# Patient Record
Sex: Male | Born: 1960
Health system: Southern US, Community
[De-identification: ages and names within clinical notes are randomized; demographics above are authoritative.]

## PROBLEM LIST (undated history)

## (undated) DIAGNOSIS — T8859XA Other complications of anesthesia, initial encounter: Secondary | ICD-10-CM

## (undated) DIAGNOSIS — I480 Paroxysmal atrial fibrillation: Secondary | ICD-10-CM

## (undated) DIAGNOSIS — E785 Hyperlipidemia, unspecified: Secondary | ICD-10-CM

## (undated) DIAGNOSIS — K219 Gastro-esophageal reflux disease without esophagitis: Secondary | ICD-10-CM

## (undated) DIAGNOSIS — M1990 Unspecified osteoarthritis, unspecified site: Secondary | ICD-10-CM

## (undated) DIAGNOSIS — R9431 Abnormal electrocardiogram [ECG] [EKG]: Secondary | ICD-10-CM

## (undated) DIAGNOSIS — T4145XA Adverse effect of unspecified anesthetic, initial encounter: Secondary | ICD-10-CM

## (undated) DIAGNOSIS — R000 Tachycardia, unspecified: Secondary | ICD-10-CM

## (undated) HISTORY — DX: Hyperlipidemia, unspecified: E78.5

## (undated) HISTORY — PX: COLONOSCOPY: SHX174

## (undated) HISTORY — PX: VASECTOMY: SHX75

## (undated) HISTORY — DX: Abnormal electrocardiogram (ECG) (EKG): R94.31

## (undated) HISTORY — PX: WISDOM TOOTH EXTRACTION: SHX21

---

## 2000-07-24 ENCOUNTER — Encounter: Admission: RE | Admit: 2000-07-24 | Discharge: 2000-07-24 | Payer: Self-pay | Admitting: Family Medicine

## 2006-07-20 ENCOUNTER — Encounter: Admission: RE | Admit: 2006-07-20 | Discharge: 2006-07-20 | Payer: Self-pay | Admitting: Orthopedic Surgery

## 2012-02-03 DIAGNOSIS — R9431 Abnormal electrocardiogram [ECG] [EKG]: Secondary | ICD-10-CM

## 2012-02-03 HISTORY — DX: Abnormal electrocardiogram (ECG) (EKG): R94.31

## 2013-01-26 ENCOUNTER — Encounter: Payer: Self-pay | Admitting: Cardiovascular Disease

## 2013-01-27 ENCOUNTER — Encounter: Payer: Self-pay | Admitting: Cardiovascular Disease

## 2013-01-27 ENCOUNTER — Other Ambulatory Visit (HOSPITAL_COMMUNITY): Payer: Self-pay | Admitting: Cardiovascular Disease

## 2013-01-27 DIAGNOSIS — R9431 Abnormal electrocardiogram [ECG] [EKG]: Secondary | ICD-10-CM

## 2013-01-27 DIAGNOSIS — R079 Chest pain, unspecified: Secondary | ICD-10-CM

## 2013-02-01 ENCOUNTER — Other Ambulatory Visit (HOSPITAL_COMMUNITY): Payer: Self-pay | Admitting: Cardiovascular Disease

## 2013-02-01 DIAGNOSIS — R079 Chest pain, unspecified: Secondary | ICD-10-CM

## 2013-02-02 ENCOUNTER — Ambulatory Visit (HOSPITAL_COMMUNITY)
Admission: RE | Admit: 2013-02-02 | Discharge: 2013-02-02 | Disposition: A | Discharge status: Home / Self Care | Payer: BC Managed Care – PPO | Source: Ambulatory Visit | Attending: Cardiology | Admitting: Cardiology

## 2013-02-02 DIAGNOSIS — R079 Chest pain, unspecified: Secondary | ICD-10-CM | POA: Insufficient documentation

## 2013-02-02 MED ORDER — TECHNETIUM TC 99M SESTAMIBI GENERIC - CARDIOLITE
10.9000 | Freq: Once | INTRAVENOUS | Status: AC | PRN
  Administered 2013-02-02: 10.9 via INTRAVENOUS

## 2013-02-02 MED ORDER — REGADENOSON 0.4 MG/5ML IV SOLN
0.4000 mg | Freq: Once | INTRAVENOUS | Status: AC
  Administered 2013-02-02: 0.4 mg via INTRAVENOUS

## 2013-02-02 MED ORDER — AMINOPHYLLINE 25 MG/ML IV SOLN
75.0000 mg | Freq: Once | INTRAVENOUS | Status: AC
  Administered 2013-02-02: 75 mg via INTRAVENOUS

## 2013-02-02 MED ORDER — TECHNETIUM TC 99M SESTAMIBI GENERIC - CARDIOLITE
31.1000 | Freq: Once | INTRAVENOUS | Status: AC | PRN
  Administered 2013-02-02: 31.1 via INTRAVENOUS

## 2013-02-02 NOTE — Procedures (Addendum)
[facility] [address] [place] [address] [place] [address] [phone]  Cardiology Nuclear Med Study  Mike Mccann is a 52 y.o. male     MRN : [id number]     DOB: 1961/03/21  Procedure Date: 02/02/2013  Nuclear Med Background Indication for Stress Test:  Surgical Clearance History:  No prior history reported. Cardiac Risk Factors: Lipids  Symptoms:  Chest Pain, Light-Headedness and Palpitations   Nuclear Pre-Procedure Caffeine/Decaff Intake:  8:00pm NPO After: 6:00am   IV Site: R Hand  IV 0.9% NS with Angio Cath:  22g  Chest Size (in):  40"  IV Started by: Emmit Pomfret, RN  Height: 5\' 11"  (1.803 m)  Cup Size: n/a  BMI:  Body mass index is 23.72 kg/(m^2). Weight:  170 lb (77.111 kg)   Tech Comments:  n/a    Nuclear Med Study 1 or 2 day study: 1 day  Stress Test Type:  Lexiscan  Order Authorizing Provider:  Susa Griffins, MD   Resting Radionuclide: Technetium 24m Sestamibi  Resting Radionuclide Dose: 10.9 mCi   Stress Radionuclide:  Technetium 43m Sestamibi  Stress Radionuclide Dose: 31.1 mCi           Stress Protocol Rest HR:45 Stress HR:81  Rest BP: 120/88 Stress BP:91/70  Exercise Time (min): n/a METS: n/a          Dose of Adenosine (mg):  n/a Dose of Lexiscan: 0.4 mg  Dose of Atropine (mg): n/a Dose of Dobutamine: n/a mcg/kg/min (at max HR)  Stress Test Technologist: Ernestene Mention, CCT Nuclear Technologist: Gonzella Lex, CNMT   Rest Procedure:  Myocardial perfusion imaging was performed at rest 45 minutes following the intravenous administration of Technetium 56m Sestamibi. Stress Procedure:  The patient received IV Lexiscan 0.4 mg over 15-seconds.  Technetium 3m Sestamibi injected at 30-seconds.  Due to patient's shortness of breath, light-headedness, drop in blood pressure, neck pressure and stomach pain, he was given IV Aminophylline 75 mg. Symptoms were resolved during recovery. There were no significant  changes with Lexiscan.  Quantitative spect images were obtained after a 45 minute delay.  Transient Ischemic Dilatation (Normal <1.22):  1.10 Lung/Heart Ratio (Normal <0.45):  0.24 QGS EDV:  117 ml QGS ESV:  66 ml LV Ejection Fraction: 44%  Signed by       Rest ECG: NSR - Normal EKG  Stress ECG: No significant change from baseline ECG  QPS Raw Data Images:  Normal; no motion artifact; normal heart/lung ratio. Stress Images:  There is decreased uptake in the inferior wall. Rest Images:  There is decreased uptake in the inferior wall. Subtraction (SDS):  There is a fixed inferior defect that is most consistent with diaphragmatic attenuation.  Impression Exercise Capacity:  Lexiscan with no exercise. BP Response:  Normal blood pressure response. Clinical Symptoms:  No significant symptoms noted. ECG Impression:  No significant ST segment change suggestive of ischemia. Comparison with Prior Nuclear Study: No significant change from previous study  Overall Impression:  Low risk stress nuclear study Diaphragmatic attenuation.  LV Wall Motion:  Mild global decrease in EF.   Runell Gess, MD  02/02/2013 12:50 PM

## 2013-02-04 NOTE — Progress Notes (Signed)
Routed to [facility] to call back

## 2013-02-08 ENCOUNTER — Ambulatory Visit (HOSPITAL_COMMUNITY): Payer: Self-pay

## 2013-02-10 NOTE — Progress Notes (Signed)
Pt. Informed about his stress test 

## 2013-02-15 ENCOUNTER — Telehealth (HOSPITAL_COMMUNITY): Payer: Self-pay | Admitting: *Deleted

## 2013-03-01 ENCOUNTER — Encounter (HOSPITAL_COMMUNITY): Admission: RE | Payer: Self-pay | Source: Ambulatory Visit

## 2013-03-01 ENCOUNTER — Inpatient Hospital Stay (HOSPITAL_COMMUNITY)
Admission: RE | Admit: 2013-03-01 | Payer: BC Managed Care – PPO | Source: Ambulatory Visit | Admitting: Orthopedic Surgery

## 2013-03-01 SURGERY — ARTHROPLASTY, HIP, TOTAL, ANTERIOR APPROACH
Anesthesia: Choice | Site: Hip | Laterality: Right

## 2013-03-22 ENCOUNTER — Other Ambulatory Visit: Payer: Self-pay | Admitting: *Deleted

## 2013-03-22 MED ORDER — SIMVASTATIN 20 MG PO TABS: 20.0000 mg | ORAL_TABLET | Freq: Every day | ORAL | Status: DC

## 2013-11-03 ENCOUNTER — Other Ambulatory Visit: Payer: Self-pay | Admitting: Cardiovascular Disease

## 2013-11-03 NOTE — Telephone Encounter (Signed)
Rx refill sent to patient pharmacy   

## 2014-01-25 ENCOUNTER — Ambulatory Visit: Payer: BC Managed Care – PPO | Admitting: Cardiology

## 2014-01-30 ENCOUNTER — Other Ambulatory Visit: Payer: Self-pay | Admitting: Cardiovascular Disease

## 2014-02-02 ENCOUNTER — Encounter: Payer: Self-pay | Admitting: Cardiology

## 2014-02-02 ENCOUNTER — Ambulatory Visit (INDEPENDENT_AMBULATORY_CARE_PROVIDER_SITE_OTHER): Payer: BC Managed Care – PPO | Admitting: Cardiology

## 2014-02-02 VITALS — BP 110/78 | HR 55 | Ht 72.0 in | Wt 172.0 lb

## 2014-02-02 DIAGNOSIS — Z01810 Encounter for preprocedural cardiovascular examination: Secondary | ICD-10-CM | POA: Insufficient documentation

## 2014-02-02 DIAGNOSIS — I479 Paroxysmal tachycardia, unspecified: Secondary | ICD-10-CM

## 2014-02-02 DIAGNOSIS — E785 Hyperlipidemia, unspecified: Secondary | ICD-10-CM

## 2014-02-02 NOTE — Patient Instructions (Addendum)
You have cardiac clearance for bilateral hip surgery.  Your physician wants you to follow-up in: 1 year with Dr. Herbie BaltimoreHarding. You will receive a reminder letter in the mail two months in advance. If you don't receive a letter, please call our office to schedule the follow-up appointment.

## 2014-02-02 NOTE — Progress Notes (Signed)
PATIENT: Mike Mccann MRN: [id number] DOB: Feb 04, 1961 PCP: Ezequiel Kayser, MD  Clinic Note: Chief Complaint  Patient presents with  . Annual Exam    former patient of [facility], NO CHEST PAIN , NO SOB , "SOMETIME I FEEL LIKE I FORGET TO BREATHE",NO EDEMA  . Medical Clearance    NEED BILATERAL  SURGERY WILL DO LEFT HIP THEN RIGHT IN APPRX 2 WEEKS- DR Charlann Boxer   HPI: Mike Mccann is a 53 y.o. male with a PMH below who presents today for one-year followup after having been a patient of Dr. Susa Griffins. He was paced leaving evaluated for tachycardia palpitations years ago. He had relatively normal echocardiogram, but atypical a stress test. There is suggestion of inferior ischemia and associated ST segment depressions after he finished 13 minutes walking on the treadmill. Any anginal symptoms. Therefore this was felt to be probably not accurate. A repeat Myoview you later suggested that the inferior defect is probably more consistent with diaphragmatic attenuation. He had repeat a nuclear to evaluate him for preoperative evaluation for bilateral hip surgery. Backed out the leg and he is now here again for preoperative evaluation..  Interval History: He is really very active and denies any significant cardiac symptoms of chest tightness or pressure with rest or exertion. No PND, orthopnea or edema. Claudication. He really can't go walking because of his hips, but he doesn't have any discomfort in his legs when he swims. He has occasions where he'll feel as though he has to gasp to catch his breath randomly but not real sense of pertinence. He denies any syncope or near CT but has had a few episodes we thought was heart was racing quite fast. Nothing in the last few months. No TA or amaurosis fugax symptoms. No melena, hematochezia, hematuria. No claudication  Discussing the tachypalpitations he had in the past, he says that he still does have spells where his heart rate will go up fast but did not  associate with him doing anything particular besides potentially having with anxiety. These episodes are relatively infrequent and not overly symptomatic. He has not had any syncope or near syncope.  Past Medical History  Diagnosis Date  . Tachycardia, paroxysmal     No true diagnosis by event monitor  . Dyslipidemia, goal LDL below 130     On statin.  . Abnormal finding on EKG October 2013     a) 2-D Echo, Mod RA& mild LA dilation, RVSP 33 mmHg. Nl LV function - EF > 55%. No valvular dz;; b) Myoview: ~ mild basal-mid inferoseptal ischemia: A low risk scan with excellent exercise capacity of 15 METS, exercising for 13 min, but horizontal ST depression in Lat leads;; c) repeat Myoview 02/2013: LOW RISK study w/ diaphragmatic attenuation     Prior Cardiac Evaluation and Past Surgical History: History reviewed. No pertinent past surgical history.  No Known Allergies  Current Outpatient Prescriptions  Medication Sig Dispense Refill  . aspirin EC 81 MG tablet TAKES TWICE A WEEK      . Glucosamine-Chondroitin (GLUCOSAMINE CHONDR COMPLEX PO) Take by mouth daily as needed.      . simvastatin (ZOCOR) 20 MG tablet Take 1 tablet (20 mg total) by mouth daily at 6 PM. APPOINTMENT NEEDED FOR FURTHER REFILLS  30 tablet  2   No current facility-administered medications for this visit.    History   Social History Narrative   Married father of 2. 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   He [place] in the [place] and swims in the outside pool in the summer and [facility] the [facility] during the wintertime. He also lives his bicycle when his hips are not bothering him.    family history is not on file.  ROS: A comprehensive Review of Systems - was performed Review of Systems  HENT: Negative for nosebleeds and sore throat.   Respiratory: Negative.  Negative for cough, hemoptysis, sputum production, shortness of breath and wheezing.   Cardiovascular: Positive for  palpitations. Negative for chest pain, orthopnea, claudication and PND.  Gastrointestinal: Negative for heartburn, blood in stool and melena.  Genitourinary: Negative for hematuria.  Musculoskeletal: Positive for joint pain.       Bilateral hip pain  Neurological: Positive for dizziness. Negative for sensory change, speech change, focal weakness, seizures, loss of consciousness and headaches.       When his heart rate goes up  Endo/Heme/Allergies: Negative.   Psychiatric/Behavioral:       Was less nervous or anxious because of less social stressors.  All other systems reviewed and are negative.  PHYSICAL EXAM BP 110/78  Pulse 55  Ht 6' (1.829 m)  Wt 172 lb (78.019 kg)  BMI 23.32 kg/m2 General appearance: alert, cooperative, appears stated age, no distress and Very healthy-appearing answers questions appropriately. Neck: no adenopathy, no carotid bruit, no JVD, supple, symmetrical, trachea midline and HEENT - [place]/AT, PERRL, EOMI Lungs: clear to auscultation bilaterally, normal percussion bilaterally and Nonlabored, good air movement Heart: normal apical impulse, regularly irregular rhythm, S1, S2 normal, no S3 or S4, no click, no rub and 1/6 SEM at the RUSB.  Abdomen: soft, non-tender; bowel sounds normal; no masses,  no organomegaly Extremities: extremities normal, atraumatic, no cyanosis or edema Pulses: 2+ and symmetric Neurologic: Alert and oriented X 3, normal strength and tone. Normal symmetric reflexes. Normal coordination and gait   Adult ECG Report  Rate: 55 ;  Rhythm: sinus bradycardia and Voltage consistent with mild LVH.   Narrative Interpretation: Normal EKG with normal axis, voltage, and intervals  Recent Labs: None available. Monitored by PCP.  ASSESSMENT / PLAN:  overall relatively stable gentleman with no real active cardiac symptoms. His initial Myoview was somewhat equivocal but a followup Myoview was clearly not ischemic.  The tachypalpitations have occurred less  frequently with the decrease in social stressors.   Preoperative cardiovascular examination Bilateral hip surgery is an intermediate-high-risk surgery, but he is a low risk cardiac patient. No active coronary disease, no heart failure, no arrhythmias, no diabetes on insulin, normal renal function, no cerebrovascular disease.  In the absence of symptoms and risk factors, the recommendation would be to proceed with the operation without any further evaluation.  Tachycardia, paroxysmal Not really actively symptomatic. No need to treat. I did talk about potential vagal maneuvers if they are prolonged. It is not overly symptomatic. At this point no need to evaluate with the monitor acutely.  Dyslipidemia, goal LDL below 130 On statin. Monitored by PCP.   Orders Placed This Encounter  Procedures  . EKG 12-Lead   Meds ordered this encounter  Medications  . Glucosamine-Chondroitin (GLUCOSAMINE CHONDR COMPLEX PO)    Sig: Take by mouth daily as needed.  [name]. aspirin EC 81 MG tablet    Sig: TAKES TWICE A WEEK    Followup: 12 months  Orvilla Truett W. Herbie BaltimoreHARDING, M.D., M.S. Interventional Cardiolgy [facility]

## 2014-02-02 NOTE — Assessment & Plan Note (Signed)
Bilateral hip surgery is an intermediate-high-risk surgery, but he is a low risk cardiac patient. No active coronary disease, no heart failure, no arrhythmias, no diabetes on insulin, normal renal function, no cerebrovascular disease.  In the absence of symptoms and risk factors, the recommendation would be to proceed with the operation without any further evaluation.

## 2014-02-02 NOTE — Assessment & Plan Note (Signed)
On statin. Monitored by PCP. 

## 2014-02-02 NOTE — Assessment & Plan Note (Signed)
Not really actively symptomatic. No need to treat. I did talk about potential vagal maneuvers if they are prolonged. It is not overly symptomatic. At this point no need to evaluate with the monitor acutely.

## 2014-02-03 NOTE — Telephone Encounter (Signed)
Rx was sent to pharmacy electronically. 

## 2014-02-08 ENCOUNTER — Telehealth: Payer: Self-pay | Admitting: *Deleted

## 2014-02-08 NOTE — Telephone Encounter (Signed)
FAXED CLEARANCE FOR LEFT AND RIGHT HIP  ON 04/04/14 AND 05/02/14 No need for furtheR evaluations per Dr Herbie BaltimoreHarding MAY HOLD ASA x 5 days pre-op

## 2014-03-22 NOTE — H&P (Signed)
TOTAL HIP ADMISSION H&P  Patient is admitted for left total hip arthroplasty, anterior approach.  Subjective:  Chief Complaint:  Left hip primary OA / pain  HPI: Mike Mccann, 53 y.o. male, has a history of pain and functional disability in the left hip(s) due to arthritis and patient has failed non-surgical conservative treatments for greater than 12 weeks to include NSAID's and/or analgesics and activity modification.  Onset of symptoms was gradual starting 10 years ago with gradually worsening course since that time.The patient noted no past surgery on the left hip(s).  Patient currently rates pain in the left hip at 8 out of 10 with activity. Patient has worsening of pain with activity and weight bearing, trendelenberg gait, pain that interfers with activities of daily living and pain with passive range of motion. Patient has evidence of periarticular osteophytes and joint space narrowing by imaging studies. This condition presents safety issues increasing the risk of falls.   There is no current active infection.  Risks, benefits and expectations were discussed with the patient.  Risks including but not limited to the risk of anesthesia, blood clots, nerve damage, blood vessel damage, failure of the prosthesis, infection and up to and including death.  Patient understand the risks, benefits and expectations and wishes to proceed with surgery.   PCP: Ezequiel KayserPERINI,MARK A, MD  D/C Plans:      Home with HHPT  Post-op Meds:       No Rx given  Tranexamic Acid:      To be given - IV   Decadron:      Is to be given  FYI:     ASA post-op  Norco post-op      Past Medical History  Diagnosis Date  . Tachycardia, paroxysmal     No true diagnosis by event monitor  . Dyslipidemia, goal LDL below 130     On statin.  . Abnormal finding on EKG October 2013     a) 2-D Echo, Mod RA& mild LA dilation, RVSP 33 mmHg. Nl LV function - EF > 55%. No valvular dz;; b) Myoview: ~ mild basal-mid inferoseptal  ischemia: A low risk scan with excellent exercise capacity of 15 METS, exercising for 13 min, but horizontal ST depression in Lat leads;; c) repeat Myoview 02/2013: LOW RISK study w/ diaphragmatic attenuation   . Dysrhythmia     tachycardia  . Complication of anesthesia     given more medications than usual  . GERD (gastroesophageal reflux disease)   . Arthritis      Past Surgical History  Procedure Laterality Date  . Wisdom tooth extraction    . Colonoscopy    . Vasectomy       No Known Allergies     History  Substance Use Topics  . Smoking status: Never Smoker   . Smokeless tobacco: Not on file  . Alcohol Use: Yes     Comment: wine weekly      Review of Systems  Constitutional: Negative.   HENT: Negative.   Eyes: Negative.   Respiratory: Negative.   Cardiovascular: Negative.   Gastrointestinal: Negative.   Genitourinary: Negative.   Musculoskeletal: Positive for back pain and joint pain.  Skin: Negative.   Neurological: Negative.   Endo/Heme/Allergies: Negative.   Psychiatric/Behavioral: Negative.     Objective:  Physical Exam  Constitutional: He is oriented to person, place, and time. He appears well-developed and well-nourished.  HENT:  Head: Normocephalic and atraumatic.  Mouth/Throat: Oropharynx is clear and moist.  Eyes: Pupils are equal, round, and reactive to light.  Neck: Neck supple. No JVD present. No tracheal deviation present. No thyromegaly present.  Cardiovascular: Normal rate, regular rhythm, normal heart sounds and intact distal pulses.   Respiratory: Effort normal and breath sounds normal. No stridor. No respiratory distress. He has no wheezes.  GI: Soft. There is no tenderness. There is no guarding.  Musculoskeletal:       Left hip: He exhibits decreased range of motion, decreased strength, tenderness and bony tenderness. He exhibits no swelling, no deformity and no laceration.  Lymphadenopathy:    He has no cervical adenopathy.   Neurological: He is alert and oriented to person, place, and time.  Skin: Skin is warm and dry.  Psychiatric: He has a normal mood and affect.     Labs:  Estimated body mass index is 23.72 kg/(m^2) as calculated from the following:   Height as of 02/02/13: 5\' 11"  (1.803 m).   Weight as of 02/02/13: 77.111 kg (170 lb).   Imaging Review Plain radiographs demonstrate severe degenerative joint disease of the left hip(s). The bone quality appears to be good for age and reported activity level.  Assessment/Plan:  End stage arthritis, left hip(s)  The patient history, physical examination, clinical judgement of the provider and imaging studies are consistent with end stage degenerative joint disease of the left hip(s) and total hip arthroplasty is deemed medically necessary. The treatment options including medical management, injection therapy, arthroscopy and arthroplasty were discussed at length. The risks and benefits of total hip arthroplasty were presented and reviewed. The risks due to aseptic loosening, infection, stiffness, dislocation/subluxation,  thromboembolic complications and other imponderables were discussed.  The patient acknowledged the explanation, agreed to proceed with the plan and consent was signed. Patient is being admitted for inpatient treatment for surgery, pain control, PT, OT, prophylactic antibiotics, VTE prophylaxis, progressive ambulation and ADL's and discharge planning.The patient is planning to be discharged home with home health services.      Anastasio AuerbachMatthew S. Arless Vineyard   PA-C  03/27/2014, 2:33 PM

## 2014-03-24 ENCOUNTER — Other Ambulatory Visit (HOSPITAL_COMMUNITY): Payer: Self-pay | Admitting: *Deleted

## 2014-03-27 ENCOUNTER — Encounter (HOSPITAL_COMMUNITY): Payer: Self-pay

## 2014-03-27 ENCOUNTER — Encounter (HOSPITAL_COMMUNITY)
Admission: RE | Admit: 2014-03-27 | Discharge: 2014-03-27 | Disposition: A | Discharge status: Home / Self Care | Payer: BC Managed Care – PPO | Source: Ambulatory Visit | Attending: Orthopedic Surgery | Admitting: Orthopedic Surgery

## 2014-03-27 DIAGNOSIS — Z01812 Encounter for preprocedural laboratory examination: Secondary | ICD-10-CM | POA: Insufficient documentation

## 2014-03-27 HISTORY — DX: Unspecified osteoarthritis, unspecified site: M19.90

## 2014-03-27 HISTORY — DX: Other complications of anesthesia, initial encounter: T88.59XA

## 2014-03-27 HISTORY — DX: Gastro-esophageal reflux disease without esophagitis: K21.9

## 2014-03-27 HISTORY — DX: Adverse effect of unspecified anesthetic, initial encounter: T41.45XA

## 2014-03-27 LAB — URINALYSIS, ROUTINE W REFLEX MICROSCOPIC
BILIRUBIN URINE: NEGATIVE
GLUCOSE, UA: NEGATIVE mg/dL
Hgb urine dipstick: NEGATIVE
Ketones, ur: NEGATIVE mg/dL
Leukocytes, UA: NEGATIVE
Nitrite: NEGATIVE
PH: 5.5 (ref 5.0–8.0)
PROTEIN: NEGATIVE mg/dL
Specific Gravity, Urine: 1.008 (ref 1.005–1.030)
Urobilinogen, UA: 0.2 mg/dL (ref 0.0–1.0)

## 2014-03-27 LAB — CBC
HEMATOCRIT: 42.8 % (ref 39.0–52.0)
Hemoglobin: 14.7 g/dL (ref 13.0–17.0)
MCH: 32.2 pg (ref 26.0–34.0)
MCHC: 34.3 g/dL (ref 30.0–36.0)
MCV: 93.7 fL (ref 78.0–100.0)
Platelets: 226 10*3/uL (ref 150–400)
RBC: 4.57 MIL/uL (ref 4.22–5.81)
RDW: 12.6 % (ref 11.5–15.5)
WBC: 4.6 10*3/uL (ref 4.0–10.5)

## 2014-03-27 LAB — BASIC METABOLIC PANEL
ANION GAP: 12 (ref 5–15)
BUN: 20 mg/dL (ref 6–23)
CO2: 26 mEq/L (ref 19–32)
Calcium: 9.9 mg/dL (ref 8.4–10.5)
Chloride: 104 mEq/L (ref 96–112)
Creatinine, Ser: 0.96 mg/dL (ref 0.50–1.35)
GFR calc Af Amer: >90 mL/min (ref >90)
GLUCOSE: 90 mg/dL (ref 70–99)
Potassium: 5 mEq/L (ref 3.7–5.3)
SODIUM: 142 meq/L (ref 137–147)

## 2014-03-27 LAB — SURGICAL PCR SCREEN
MRSA, PCR: NEGATIVE
STAPHYLOCOCCUS AUREUS: NEGATIVE

## 2014-03-27 LAB — PROTIME-INR
INR: 1.12 (ref 0.00–1.49)
Prothrombin Time: 14.5 seconds (ref 11.6–15.2)

## 2014-03-27 LAB — APTT: APTT: 35 s (ref 24–37)

## 2014-03-27 NOTE — Progress Notes (Signed)
02/02/2014-Pre-operative clearance from Dr. Derek MoundHarding,Dr. Perini,  On chart. 03/10/2014- Labs on chart from [facility]Hemosure,U/A,CMET, CBC, Lipid

## 2014-03-27 NOTE — Patient Instructions (Signed)
20     Your procedure is scheduled on:   Tuesday 04/04/2014  Report [facility] and follow signs to Short Stay  At 0830  AM.  Call this number if you have problems the morning of surgery: [phone]                    Remember:          Do not eat food or drink liquids AFTER MIDNIGHT!  Take these medicines the morning of surgery with A SIP OF WATER:  Omeprazole    [place] IS NOT RESPONSIBLE FOR ANY BELONGINGS OR VALUABLES BROUGHT TO HOSPITAL.  [name].  Leave suitcase in the car. After surgery it may be brought to your room.     DO NOT WEAR  JEWELRY,MAKE-UP,LOTIONS,POWDERS,PERFUMES,CONTACTS , DENTURES, BRIDGEWORK AND DO NOT WEAR FALSE EYELASHES  !                                  Patients discharged the day of surgery will not be allowed to drive home.   If going home the same day of surgery, must have someone stay with you first 24 hrs.at home and arrange for someone to drive you home from the Hospital.                          YOUR DRIVER IS: N/A   Special Instructions:              Please read over the following fact sheets that you were given:             1. [place] PREPARING FOR SURGERY SHEET              2.INCENTIVE SPIROMETRY                                                        [place] - Preparing for Surgery Before surgery, you can play an important role.  Because skin is not sterile, your skin needs to be as free of germs as possible.  You can reduce the number of germs on your skin by washing with CHG (chlorahexidine gluconate) soap before surgery.  CHG is an antiseptic cleaner which kills germs and bonds with the skin to continue killing germs even after washing. Please DO NOT use if you have an allergy to CHG or antibacterial soaps.  If your skin becomes reddened/irritated stop using the CHG and inform your nurse when you arrive at Short Stay. Do not shave (including legs and underarms) for at least 48 hours prior to the first CHG  shower.  You may shave your face/neck. Please follow these instructions carefully:  1.  Shower with CHG Soap the night before surgery and the  morning of Surgery.  2.  If you choose to wash your hair, wash your hair first as usual with your  normal  shampoo.  3.  After you shampoo, rinse your hair and body thoroughly to remove the  shampoo.                           4.  Use CHG as you would  any other liquid soap.  You can apply chg directly  to the skin and wash                       Gently with a scrungie or clean washcloth.  5.  Apply the CHG Soap to your body ONLY FROM THE NECK DOWN.   Do not use on face/ open                           Wound or open sores. Avoid contact with eyes, ears mouth and genitals (private parts).                       Wash face,  Genitals (private parts) with your normal soap.             6.  Wash thoroughly, paying special attention to the area where your surgery  will be performed.  7.  Thoroughly rinse your body with warm water from the neck down.  8.  DO NOT shower/wash with your normal soap after using and rinsing off  the CHG Soap.                9.  Pat yourself dry with a clean towel.            10.  Wear clean pajamas.            11.  Place clean sheets on your bed the night of your first shower and do not  sleep with pets. Day of Surgery : Do not apply any lotions/deodorants the morning of surgery.  Please wear clean clothes to the hospital/surgery center.  FAILURE TO FOLLOW THESE INSTRUCTIONS MAY RESULT IN THE CANCELLATION OF YOUR SURGERY PATIENT SIGNATURE_________________________________  NURSE SIGNATURE__________________________________  ________________________________________________________________________   Rogelia Mire  An incentive spirometer is a tool that can help keep your lungs clear and active. This tool measures how well you are filling your lungs with each breath. Taking long deep breaths may help reverse or decrease the chance  of developing breathing (pulmonary) problems (especially infection) following:  A long period of time when you are unable to move or be active. BEFORE THE PROCEDURE   If the spirometer includes an indicator to show your best effort, your nurse or respiratory therapist will set it to a desired goal.  If possible, sit up straight or lean slightly forward. Try not to slouch.  Hold the incentive spirometer in an upright position. INSTRUCTIONS FOR USE   Sit on the edge of your bed if possible, or sit up as far as you can in bed or on a chair.  Hold the incentive spirometer in an upright position.  Breathe out normally.  Place the mouthpiece in your mouth and seal your lips tightly around it.  Breathe in slowly and as deeply as possible, raising the piston or the ball toward the top of the column.  Hold your breath for 3-5 seconds or for as long as possible. Allow the piston or ball to fall to the bottom of the column.  Remove the mouthpiece from your mouth and breathe out normally.  Rest for a few seconds and repeat Steps 1 through 7 at least 10 times every 1-2 hours when you are awake. Take your time and take a few normal breaths between deep breaths.  The spirometer may include an indicator to show your best effort. Use the indicator as  a goal to work toward during each repetition.  After each set of 10 deep breaths, practice coughing to be sure your lungs are clear. If you have an incision (the cut made at the time of surgery), support your incision when coughing by placing a pillow or rolled up towels firmly against it. Once you are able to get out of bed, walk around indoors and cough well. You may stop using the incentive spirometer when instructed by your caregiver.  RISKS AND COMPLICATIONS  Take your time so you do not get dizzy or light-headed.  If you are in pain, you may need to take or ask for pain medication before doing incentive spirometry. It is harder to take a deep  breath if you are having pain. AFTER USE  Rest and breathe slowly and easily.  It can be helpful to keep track of a log of your progress. Your caregiver can provide you with a simple table to help with this. If you are using the spirometer at home, follow these instructions: SEEK MEDICAL CARE IF:   You are having difficultly using the spirometer.  You have trouble using the spirometer as often as instructed.  Your pain medication is not giving enough relief while using the spirometer.  You develop fever of 100.5 F (38.1 C) or higher. SEEK IMMEDIATE MEDICAL CARE IF:   You cough up bloody sputum that had not been present before.  You develop fever of 102 F (38.9 C) or greater.  You develop worsening pain at or near the incision site. MAKE SURE YOU:   Understand these instructions.  Will watch your condition.  Will get help right away if you are not doing well or get worse. Document Released: 09/01/2006 Document Revised: 07/14/2011 Document Reviewed: 11/02/2006 ExitCare Patient Information 2014 [employer], [place].   ________________________________________________________________________  WHAT IS A BLOOD TRANSFUSION? Blood Transfusion Information  A transfusion is the replacement of blood or some of its parts. Blood is made up of multiple cells which provide different functions.  Red blood cells carry oxygen and are used for blood loss replacement.  White blood cells fight against infection.  Platelets control bleeding.  Plasma helps clot blood.  Other blood products are available for specialized needs, such as hemophilia or other clotting disorders. BEFORE THE TRANSFUSION  Who gives blood for transfusions?   Healthy volunteers who are fully evaluated to make sure their blood is safe. This is blood bank blood. Transfusion therapy is the safest it has ever been in the practice of medicine. Before blood is taken from a donor, a complete history is taken to make sure  that person has no history of diseases nor engages in risky social behavior (examples are intravenous drug use or sexual activity with multiple partners). The donor's travel history is screened to minimize risk of transmitting infections, such as malaria. The donated blood is tested for signs of infectious diseases, such as HIV and hepatitis. The blood is then tested to be sure it is compatible with you in order to minimize the chance of a transfusion reaction. If you or a relative donates blood, this is often done in anticipation of surgery and is not appropriate for emergency situations. It takes many days to process the donated blood. RISKS AND COMPLICATIONS Although transfusion therapy is very safe and saves many lives, the main dangers of transfusion include:   Getting an infectious disease.  Developing a transfusion reaction. This is an allergic reaction to something in the blood you were given.  Every precaution is taken to prevent this. The decision to have a blood transfusion has been considered carefully by your caregiver before blood is given. Blood is not given unless the benefits outweigh the risks. AFTER THE TRANSFUSION  Right after receiving a blood transfusion, you will usually feel much better and more energetic. This is especially true if your red blood cells have gotten low (anemic). The transfusion raises the level of the red blood cells which carry oxygen, and this usually causes an energy increase.  The nurse administering the transfusion will monitor you carefully for complications. HOME CARE INSTRUCTIONS  No special instructions are needed after a transfusion. You may find your energy is better. Speak with your caregiver about any limitations on activity for underlying diseases you may have. SEEK MEDICAL CARE IF:   Your condition is not improving after your transfusion.  You develop redness or irritation at the intravenous (IV) site. SEEK IMMEDIATE MEDICAL CARE IF:  Any of  the following symptoms occur over the next 12 hours:  Shaking chills.  You have a temperature by mouth above 102 F (38.9 C), not controlled by medicine.  Chest, back, or muscle pain.  People around you feel you are not acting correctly or are confused.  Shortness of breath or difficulty breathing.  Dizziness and fainting.  You get a rash or develop hives.  You have a decrease in urine output.  Your urine turns a dark color or changes to pink, red, or brown. Any of the following symptoms occur over the next 10 days:  You have a temperature by mouth above 102 F (38.9 C), not controlled by medicine.  Shortness of breath.  Weakness after normal activity.  The white part of the eye turns yellow (jaundice).  You have a decrease in the amount of urine or are urinating less often.  Your urine turns a dark color or changes to pink, red, or brown. Document Released: 04/18/2000 Document Revised: 07/14/2011 Document Reviewed: 12/06/2007 [facility] Patient Information 2014 [place], [place].  _______________________________________________________________________

## 2014-04-04 ENCOUNTER — Encounter (HOSPITAL_COMMUNITY): Payer: Self-pay | Admitting: *Deleted

## 2014-04-04 ENCOUNTER — Inpatient Hospital Stay (HOSPITAL_COMMUNITY): Payer: BC Managed Care – PPO

## 2014-04-04 ENCOUNTER — Inpatient Hospital Stay (HOSPITAL_COMMUNITY): Payer: BC Managed Care – PPO | Admitting: Anesthesiology

## 2014-04-04 ENCOUNTER — Encounter (HOSPITAL_COMMUNITY)
Admission: RE | Disposition: A | Discharge status: Home Health | Payer: Self-pay | Source: Ambulatory Visit | Attending: Orthopedic Surgery

## 2014-04-04 ENCOUNTER — Inpatient Hospital Stay (HOSPITAL_COMMUNITY)
Admission: RE | Admit: 2014-04-04 | Discharge: 2014-04-05 | DRG: 470 | Disposition: A | Discharge status: Home Health | Payer: BC Managed Care – PPO | Source: Ambulatory Visit | Attending: Orthopedic Surgery | Admitting: Orthopedic Surgery

## 2014-04-04 DIAGNOSIS — M25562 Pain in left knee: Secondary | ICD-10-CM | POA: Diagnosis present

## 2014-04-04 DIAGNOSIS — K219 Gastro-esophageal reflux disease without esophagitis: Secondary | ICD-10-CM | POA: Diagnosis present

## 2014-04-04 DIAGNOSIS — Z9852 Vasectomy status: Secondary | ICD-10-CM

## 2014-04-04 DIAGNOSIS — M1612 Unilateral primary osteoarthritis, left hip: Principal | ICD-10-CM | POA: Diagnosis present

## 2014-04-04 DIAGNOSIS — I959 Hypotension, unspecified: Secondary | ICD-10-CM | POA: Diagnosis present

## 2014-04-04 DIAGNOSIS — E785 Hyperlipidemia, unspecified: Secondary | ICD-10-CM | POA: Diagnosis present

## 2014-04-04 DIAGNOSIS — Z96649 Presence of unspecified artificial hip joint: Secondary | ICD-10-CM

## 2014-04-04 HISTORY — PX: TOTAL HIP ARTHROPLASTY: SHX124

## 2014-04-04 LAB — ABO/RH: ABO/RH(D): A NEG

## 2014-04-04 LAB — TYPE AND SCREEN
ABO/RH(D): A NEG
Antibody Screen: NEGATIVE

## 2014-04-04 SURGERY — ARTHROPLASTY, HIP, TOTAL, ANTERIOR APPROACH
Anesthesia: Spinal | Site: Hip | Laterality: Left

## 2014-04-04 MED ORDER — ASPIRIN EC 325 MG PO TBEC
325.0000 mg | DELAYED_RELEASE_TABLET | Freq: Two times a day (BID) | ORAL | Status: DC
  Administered 2014-04-05: 325 mg via ORAL
  Filled 2014-04-04 (×3): qty 1

## 2014-04-04 MED ORDER — TRANEXAMIC ACID 100 MG/ML IV SOLN
1000.0000 mg | Freq: Once | INTRAVENOUS | Status: AC
  Administered 2014-04-04: 1000 mg via INTRAVENOUS
  Filled 2014-04-04: qty 10

## 2014-04-04 MED ORDER — ALUM & MAG HYDROXIDE-SIMETH 200-200-20 MG/5ML PO SUSP: 30.0000 mL | ORAL | Status: DC | PRN

## 2014-04-04 MED ORDER — PROPOFOL INFUSION 10 MG/ML OPTIME
INTRAVENOUS | Status: DC | PRN
  Administered 2014-04-04: 100 ug/kg/min via INTRAVENOUS

## 2014-04-04 MED ORDER — POLYETHYLENE GLYCOL 3350 17 G PO PACK
17.0000 g | PACK | Freq: Two times a day (BID) | ORAL | Status: DC
  Administered 2014-04-04 – 2014-04-05 (×2): 17 g via ORAL

## 2014-04-04 MED ORDER — CHLORHEXIDINE GLUCONATE 4 % EX LIQD: 60.0000 mL | Freq: Once | CUTANEOUS | Status: DC

## 2014-04-04 MED ORDER — DEXAMETHASONE SODIUM PHOSPHATE 10 MG/ML IJ SOLN
INTRAMUSCULAR | Status: AC
  Filled 2014-04-04: qty 1

## 2014-04-04 MED ORDER — ONDANSETRON HCL 4 MG/2ML IJ SOLN: 4.0000 mg | Freq: Four times a day (QID) | INTRAMUSCULAR | Status: DC | PRN

## 2014-04-04 MED ORDER — CEFAZOLIN SODIUM-DEXTROSE 2-3 GM-% IV SOLR
2.0000 g | INTRAVENOUS | Status: AC
  Administered 2014-04-04: 2 g via INTRAVENOUS

## 2014-04-04 MED ORDER — MENTHOL 3 MG MT LOZG: 1.0000 | LOZENGE | OROMUCOSAL | Status: DC | PRN

## 2014-04-04 MED ORDER — DIPHENHYDRAMINE HCL 25 MG PO CAPS: 25.0000 mg | ORAL_CAPSULE | Freq: Four times a day (QID) | ORAL | Status: DC | PRN

## 2014-04-04 MED ORDER — MIDAZOLAM HCL 5 MG/5ML IJ SOLN
INTRAMUSCULAR | Status: DC | PRN
  Administered 2014-04-04: 2 mg via INTRAVENOUS

## 2014-04-04 MED ORDER — PROPOFOL 10 MG/ML IV BOLUS
INTRAVENOUS | Status: AC
  Filled 2014-04-04: qty 20

## 2014-04-04 MED ORDER — DOCUSATE SODIUM 100 MG PO CAPS
100.0000 mg | ORAL_CAPSULE | Freq: Two times a day (BID) | ORAL | Status: DC
  Administered 2014-04-04 – 2014-04-05 (×2): 100 mg via ORAL

## 2014-04-04 MED ORDER — BUPIVACAINE HCL (PF) 0.5 % IJ SOLN
INTRAMUSCULAR | Status: AC
  Filled 2014-04-04: qty 30

## 2014-04-04 MED ORDER — ONDANSETRON HCL 4 MG/2ML IJ SOLN: 4.0000 mg | Freq: Once | INTRAMUSCULAR | Status: DC | PRN

## 2014-04-04 MED ORDER — ONDANSETRON HCL 4 MG PO TABS: 4.0000 mg | ORAL_TABLET | Freq: Four times a day (QID) | ORAL | Status: DC | PRN

## 2014-04-04 MED ORDER — DEXAMETHASONE SODIUM PHOSPHATE 10 MG/ML IJ SOLN
10.0000 mg | Freq: Once | INTRAMUSCULAR | Status: AC
  Administered 2014-04-05: 10 mg via INTRAVENOUS
  Filled 2014-04-04: qty 1

## 2014-04-04 MED ORDER — LACTATED RINGERS IV SOLN
INTRAVENOUS | Status: DC
  Administered 2014-04-04: 1000 mL via INTRAVENOUS

## 2014-04-04 MED ORDER — PHENOL 1.4 % MT LIQD: 1.0000 | OROMUCOSAL | Status: DC | PRN

## 2014-04-04 MED ORDER — MAGNESIUM CITRATE PO SOLN: 1.0000 | Freq: Once | ORAL | Status: AC | PRN

## 2014-04-04 MED ORDER — FENTANYL CITRATE 0.05 MG/ML IJ SOLN
INTRAMUSCULAR | Status: DC | PRN
  Administered 2014-04-04: 50 ug via INTRAVENOUS

## 2014-04-04 MED ORDER — METOCLOPRAMIDE HCL 10 MG PO TABS: 5.0000 mg | ORAL_TABLET | Freq: Three times a day (TID) | ORAL | Status: DC | PRN

## 2014-04-04 MED ORDER — METOCLOPRAMIDE HCL 5 MG/ML IJ SOLN: 5.0000 mg | Freq: Three times a day (TID) | INTRAMUSCULAR | Status: DC | PRN

## 2014-04-04 MED ORDER — LIDOCAINE HCL (CARDIAC) 20 MG/ML IV SOLN
INTRAVENOUS | Status: DC | PRN
  Administered 2014-04-04: 100 mg via INTRAVENOUS

## 2014-04-04 MED ORDER — DEXAMETHASONE SODIUM PHOSPHATE 10 MG/ML IJ SOLN
INTRAMUSCULAR | Status: DC | PRN
  Administered 2014-04-04: 10 mg via INTRAVENOUS

## 2014-04-04 MED ORDER — BISACODYL 10 MG RE SUPP: 10.0000 mg | Freq: Every day | RECTAL | Status: DC | PRN

## 2014-04-04 MED ORDER — ONDANSETRON HCL 4 MG/2ML IJ SOLN
INTRAMUSCULAR | Status: AC
  Filled 2014-04-04: qty 2

## 2014-04-04 MED ORDER — 0.9 % SODIUM CHLORIDE (POUR BTL) OPTIME
TOPICAL | Status: DC | PRN
  Administered 2014-04-04: 1000 mL

## 2014-04-04 MED ORDER — FENTANYL CITRATE 0.05 MG/ML IJ SOLN
INTRAMUSCULAR | Status: AC
  Filled 2014-04-04: qty 2

## 2014-04-04 MED ORDER — FERROUS SULFATE 325 (65 FE) MG PO TABS
325.0000 mg | ORAL_TABLET | Freq: Three times a day (TID) | ORAL | Status: DC
  Administered 2014-04-05 (×2): 325 mg via ORAL
  Filled 2014-04-04 (×5): qty 1

## 2014-04-04 MED ORDER — CEFAZOLIN SODIUM-DEXTROSE 2-3 GM-% IV SOLR
INTRAVENOUS | Status: AC
  Filled 2014-04-04: qty 50

## 2014-04-04 MED ORDER — METHOCARBAMOL 1000 MG/10ML IJ SOLN
500.0000 mg | Freq: Four times a day (QID) | INTRAVENOUS | Status: DC | PRN
  Administered 2014-04-04: 500 mg via INTRAVENOUS
  Filled 2014-04-04 (×2): qty 5

## 2014-04-04 MED ORDER — HYDROCODONE-ACETAMINOPHEN 7.5-325 MG PO TABS
1.0000 | ORAL_TABLET | ORAL | Status: DC
  Administered 2014-04-04: 1 via ORAL
  Administered 2014-04-05: 2 via ORAL
  Administered 2014-04-05: 1 via ORAL
  Administered 2014-04-05 (×2): 2 via ORAL
  Filled 2014-04-04 (×2): qty 2
  Filled 2014-04-04: qty 1
  Filled 2014-04-04: qty 2
  Filled 2014-04-04: qty 1
  Filled 2014-04-04: qty 2

## 2014-04-04 MED ORDER — POTASSIUM CHLORIDE 2 MEQ/ML IV SOLN
100.0000 mL/h | INTRAVENOUS | Status: DC
  Administered 2014-04-04 – 2014-04-05 (×2): 100 mL/h via INTRAVENOUS
  Filled 2014-04-04 (×5): qty 1000

## 2014-04-04 MED ORDER — BUPIVACAINE HCL (PF) 0.5 % IJ SOLN
INTRAMUSCULAR | Status: DC | PRN
  Administered 2014-04-04: 3 mL

## 2014-04-04 MED ORDER — MIDAZOLAM HCL 2 MG/2ML IJ SOLN
INTRAMUSCULAR | Status: AC
  Filled 2014-04-04: qty 2

## 2014-04-04 MED ORDER — METHOCARBAMOL 500 MG PO TABS
500.0000 mg | ORAL_TABLET | Freq: Four times a day (QID) | ORAL | Status: DC | PRN
  Administered 2014-04-05: 500 mg via ORAL
  Filled 2014-04-04: qty 1

## 2014-04-04 MED ORDER — CEFAZOLIN SODIUM-DEXTROSE 2-3 GM-% IV SOLR
2.0000 g | Freq: Four times a day (QID) | INTRAVENOUS | Status: AC
  Administered 2014-04-04 – 2014-04-05 (×2): 2 g via INTRAVENOUS
  Filled 2014-04-04 (×2): qty 50

## 2014-04-04 MED ORDER — CELECOXIB 200 MG PO CAPS
200.0000 mg | ORAL_CAPSULE | Freq: Two times a day (BID) | ORAL | Status: DC
  Administered 2014-04-04 – 2014-04-05 (×2): 200 mg via ORAL
  Filled 2014-04-04 (×3): qty 1

## 2014-04-04 MED ORDER — ONDANSETRON HCL 4 MG/2ML IJ SOLN
INTRAMUSCULAR | Status: DC | PRN
  Administered 2014-04-04: 4 mg via INTRAVENOUS

## 2014-04-04 MED ORDER — OMEPRAZOLE 20 MG PO CPDR
20.0000 mg | DELAYED_RELEASE_CAPSULE | Freq: Every day | ORAL | Status: DC
  Administered 2014-04-05: 20 mg via ORAL
  Filled 2014-04-04: qty 1

## 2014-04-04 MED ORDER — LIDOCAINE HCL (CARDIAC) 20 MG/ML IV SOLN
INTRAVENOUS | Status: AC
  Filled 2014-04-04: qty 5

## 2014-04-04 MED ORDER — FENTANYL CITRATE 0.05 MG/ML IJ SOLN: 25.0000 ug | INTRAMUSCULAR | Status: DC | PRN

## 2014-04-04 MED ORDER — HYDROMORPHONE HCL 1 MG/ML IJ SOLN: 0.5000 mg | INTRAMUSCULAR | Status: DC | PRN

## 2014-04-04 MED ORDER — SIMVASTATIN 20 MG PO TABS
20.0000 mg | ORAL_TABLET | Freq: Every day | ORAL | Status: DC
  Administered 2014-04-04: 20 mg via ORAL
  Filled 2014-04-04 (×2): qty 1

## 2014-04-04 SURGICAL SUPPLY — 37 items
BAG ZIPLOCK 12X15 (MISCELLANEOUS) IMPLANT
CAPT HIP TOTAL 2 ×2 IMPLANT
COVER PERINEAL POST (MISCELLANEOUS) ×2 IMPLANT
DRAPE C-ARM 42X120 X-RAY (DRAPES) ×2 IMPLANT
DRAPE STERI IOBAN 125X83 (DRAPES) ×2 IMPLANT
DRAPE U-SHAPE 47X51 STRL (DRAPES) ×6 IMPLANT
DRSG AQUACEL AG ADV 3.5X10 (GAUZE/BANDAGES/DRESSINGS) ×2 IMPLANT
DURAPREP 26ML APPLICATOR (WOUND CARE) ×2 IMPLANT
ELECT BLADE TIP CTD 4 INCH (ELECTRODE) ×2 IMPLANT
ELECT PENCIL ROCKER SW 15FT (MISCELLANEOUS) IMPLANT
ELECT REM PT RETURN 15FT ADLT (MISCELLANEOUS) IMPLANT
ELECT REM PT RETURN 9FT ADLT (ELECTROSURGICAL) ×2
ELECTRODE REM PT RTRN 9FT ADLT (ELECTROSURGICAL) ×1 IMPLANT
FACESHIELD WRAPAROUND (MASK) ×8 IMPLANT
GLOVE BIOGEL M 7.0 STRL (GLOVE) ×4 IMPLANT
GLOVE BIOGEL PI IND STRL 7.5 (GLOVE) ×1 IMPLANT
GLOVE BIOGEL PI IND STRL 8.5 (GLOVE) ×1 IMPLANT
GLOVE BIOGEL PI INDICATOR 7.5 (GLOVE) ×1
GLOVE BIOGEL PI INDICATOR 8.5 (GLOVE) ×1
GLOVE ECLIPSE 8.0 STRL XLNG CF (GLOVE) IMPLANT
GLOVE ORTHO TXT STRL SZ7.5 (GLOVE) ×2 IMPLANT
GOWN SPEC L3 XXLG W/TWL (GOWN DISPOSABLE) IMPLANT
GOWN STRL REUS W/TWL LRG LVL3 (GOWN DISPOSABLE) ×2 IMPLANT
HOLDER FOLEY CATH W/STRAP (MISCELLANEOUS) ×2 IMPLANT
KIT BASIN OR (CUSTOM PROCEDURE TRAY) ×2 IMPLANT
LIQUID BAND (GAUZE/BANDAGES/DRESSINGS) ×2 IMPLANT
PACK TOTAL JOINT (CUSTOM PROCEDURE TRAY) ×2 IMPLANT
SAW OSC TIP CART 19.5X105X1.3 (SAW) ×2 IMPLANT
SUT MNCRL AB 4-0 PS2 18 (SUTURE) ×2 IMPLANT
SUT VIC AB 1 CT1 36 (SUTURE) ×6 IMPLANT
SUT VIC AB 2-0 CT1 27 (SUTURE) ×2
SUT VIC AB 2-0 CT1 TAPERPNT 27 (SUTURE) ×2 IMPLANT
SUT VLOC 180 0 24IN GS25 (SUTURE) ×2 IMPLANT
TOWEL OR 17X26 10 PK STRL BLUE (TOWEL DISPOSABLE) ×2 IMPLANT
TOWEL OR NON WOVEN STRL DISP B (DISPOSABLE) ×2 IMPLANT
TRAY FOLEY CATH 14FRSI W/METER (CATHETERS) IMPLANT
WATER STERILE IRR 1500ML POUR (IV SOLUTION) ×2 IMPLANT

## 2014-04-04 NOTE — Transfer of Care (Signed)
Immediate Anesthesia Transfer of Care Note  Patient: Mike Mccann  Procedure(s) Performed: Procedure(s): LEFT TOTAL HIP ARTHROPLASTY ANTERIOR APPROACH (Left)  Patient Location: PACU  Anesthesia Type:Spinal  Level of Consciousness: awake, alert , oriented and patient cooperative  Airway & Oxygen Therapy: Patient Spontanous Breathing and Patient connected to face mask oxygen  Post-op Assessment: Report given to PACU RN and Post -op Vital signs reviewed and stable  Post vital signs: Reviewed and stable  Complications: No apparent anesthesia complications

## 2014-04-04 NOTE — Anesthesia Procedure Notes (Signed)
Spinal Patient location during procedure: OR Start time: 04/04/2014 11:31 AM End time: 04/04/2014 11:39 AM Staffing Resident/CRNA: Jarvis NewcomerARMISTEAD, Kamdyn Covel A Performed by: resident/CRNA  Preanesthetic Checklist Completed: patient identified, site marked, surgical consent, pre-op evaluation, timeout performed, IV checked, risks and benefits discussed and monitors and equipment checked Spinal Block Patient position: sitting Prep: Betadine Patient monitoring: heart rate, continuous pulse ox and blood pressure Approach: midline Location: L2-3 Injection technique: single-shot Needle Needle type: Sprotte  Needle gauge: 24 G Needle length: 9 cm Additional Notes Pt placed in sitting position. Pt tolerated well. One attempt, + CSF, - heme. Spinal expiration date- 09-2015

## 2014-04-04 NOTE — Interval H&P Note (Signed)
History and Physical Interval Note:  04/04/2014 10:11 AM  [name]  has presented today for surgery, with the diagnosis of LEFT HIP OA   The various methods of treatment have been discussed with the patient and family. After consideration of risks, benefits and other options for treatment, the patient has consented to  Procedure(s): LEFT TOTAL HIP ARTHROPLASTY ANTERIOR APPROACH (Left) as a surgical intervention .  The patient's history has been reviewed, patient examined, no change in status, stable for surgery.  I have reviewed the patient's chart and labs.  Questions were answered to the patient's satisfaction.     Shelda PalLIN,Gizzelle Lacomb D

## 2014-04-04 NOTE — Anesthesia Preprocedure Evaluation (Addendum)
Anesthesia Evaluation  Patient identified by MRN, date of birth, ID band Patient awake    Reviewed: Allergy & Precautions, H&P , NPO status , Patient's Chart, lab work & pertinent test results  History of Anesthesia Complications Negative for: history of anesthetic complications  Airway Mallampati: II  TM Distance: >3 FB Neck ROM: Full    Dental no notable dental hx. (+) Dental Advisory Given   Pulmonary neg pulmonary ROS,  breath sounds clear to auscultation  Pulmonary exam normal       Cardiovascular negative cardio ROS  + dysrhythmias (tachydysrhythmia, never caught on monitor, discussed by cardiology and currently no therapy) Rhythm:Regular Rate:Normal  2-D Echo, Mod RA& mild LA dilation, RVSP 33 mmHg. Nl LV function - EF > 55%. No valvular dz;; b) Myoview: ~ mild basal-mid inferoseptal ischemia: A low risk scan with excellent exercise capacity of 15 METS, exercising for 13 min, but horizontal ST depression in Lat leads;; c) repeat Myoview 02/2013: LOW RISK study w/ diaphragmatic attenuation       Neuro/Psych negative neurological ROS  negative psych ROS   GI/Hepatic Neg liver ROS, GERD-  Medicated and Controlled,  Endo/Other  negative endocrine ROS  Renal/GU negative Renal ROS  negative genitourinary   Musculoskeletal  (+) Arthritis -, Osteoarthritis,    Abdominal   Peds negative pediatric ROS (+)  Hematology negative hematology ROS (+)   Anesthesia Other Findings   Reproductive/Obstetrics negative OB ROS                            Anesthesia Physical Anesthesia Plan  ASA: II  Anesthesia Plan: Spinal   Post-op Pain Management:    Induction: Intravenous  Airway Management Planned: Simple Face Mask  Additional Equipment:   Intra-op Plan:   Post-operative Plan: Extubation in OR  Informed Consent: I have reviewed the patients History and Physical, chart, labs and  discussed the procedure including the risks, benefits and alternatives for the proposed anesthesia with the patient or authorized representative who has indicated his/her understanding and acceptance.   Dental advisory given  Plan Discussed with: CRNA  Anesthesia Plan Comments:       Anesthesia Quick Evaluation

## 2014-04-04 NOTE — Plan of Care (Signed)
Problem: Phase I Progression Outcomes Goal: CMS/Neurovascular status WDL Outcome: Completed/Met Date Met:  04/04/14 Goal: Pain controlled with appropriate interventions Outcome: Progressing Goal: Dangle or out of bed evening of surgery Outcome: Progressing Goal: Initial discharge plan identified Outcome: Completed/Met Date Met:  04/04/14

## 2014-04-04 NOTE — Progress Notes (Signed)
Utilization review completed.  

## 2014-04-04 NOTE — Anesthesia Postprocedure Evaluation (Signed)
  Anesthesia Post-op Note  Patient: Mike Mccann  Procedure(s) Performed: Procedure(s) (LRB): LEFT TOTAL HIP ARTHROPLASTY ANTERIOR APPROACH (Left)  Patient Location: PACU  Anesthesia Type: Spinal  Level of Consciousness: awake and alert   Airway and Oxygen Therapy: Patient Spontanous Breathing  Post-op Pain: mild  Post-op Assessment: Post-op Vital signs reviewed, Patient's Cardiovascular Status Stable, Respiratory Function Stable, Patent Airway and No signs of Nausea or vomiting  Last Vitals:  Filed Vitals:   04/04/14 1400  BP:   Pulse:   Temp:   Resp: 12    Post-op Vital Signs: stable   Complications: No apparent anesthesia complications

## 2014-04-04 NOTE — Op Note (Signed)
NAME:  Mike Mccann NO.: [id number]      MEDICAL RECORD NO.: [id number]      FACILITY:  [facility]      PHYSICIAN:  Durene Romans D  DATE OF BIRTH:  Sep 24, 1960     DATE OF PROCEDURE:  04/04/2014                                 OPERATIVE REPORT         PREOPERATIVE DIAGNOSIS: Left  hip osteoarthritis.      POSTOPERATIVE DIAGNOSIS:  Left hip osteoarthritis.      PROCEDURE:  Left total hip replacement through an anterior approach   utilizing DePuy THR system, component size 54mm pinnacle cup, a size 36+4 neutral   Altrex liner, a size 6Hi Tri Lock stem with a 36+5 delta ceramic   ball.      SURGEON:  Madlyn Frankel. Charlann Boxer, M.D.      ASSISTANT:  Skip Mayer, PA-C     ANESTHESIA:  Spinal.      SPECIMENS:  None.      COMPLICATIONS:  None.      BLOOD LOSS:  300cc     DRAINS:  None.      INDICATION OF THE PROCEDURE:  Mike Mccann is a 53 y.o. male who had   presented to office for evaluation of left hip pain.  Radiographs revealed   progressive degenerative changes with bone-on-bone   articulation to the  hip joint.  The patient had painful limited range of   motion significantly affecting their overall quality of life.  The patient was failing to    respond to conservative measures, and at this point was ready   to proceed with more definitive measures.  The patient has noted progressive   degenerative changes in his hip, progressive problems and dysfunction   with regarding the hip prior to surgery.  Consent was obtained for   benefit of pain relief.  Specific risk of infection, DVT, component   failure, dislocation, need for revision surgery, as well discussion of   the anterior versus posterior approach were reviewed.  Consent was   obtained for benefit of anterior pain relief through an anterior   approach.      PROCEDURE IN DETAIL:  The patient was brought to operative theater.   Once adequate anesthesia, preoperative  antibiotics, 2gm of Ancef administered.   The patient was positioned supine on the OSI Hanna table.  Once adequate   padding of boney process was carried out, we had predraped out the hip, and  used fluoroscopy to confirm orientation of the pelvis and position.      The left hip was then prepped and draped from proximal iliac crest to   mid thigh with shower curtain technique.      Time-out was performed identifying the patient, planned procedure, and   extremity.     An incision was then made 2 cm distal and lateral to the   anterior superior iliac spine extending over the orientation of the   tensor fascia lata muscle and sharp dissection was carried down to the   fascia of the muscle and protractor placed in the soft tissues.      The fascia was then incised.  The muscle belly was identified and swept  laterally and retractor placed along the superior neck.  Following   cauterization of the circumflex vessels and removing some pericapsular   fat, a second cobra retractor was placed on the inferior neck.  A third   retractor was placed on the anterior acetabulum after elevating the   anterior rectus.  A L-capsulotomy was along the line of the   superior neck to the trochanteric fossa, then extended proximally and   distally.  Tag sutures were placed and the retractors were then placed   intracapsular.  We then identified the trochanteric fossa and   orientation of my neck cut, confirmed this radiographically   and then made a neck osteotomy with the femur on traction.  The femoral   head was removed without difficulty or complication.  Traction was let   off and retractors were placed posterior and anterior around the   acetabulum.      The labrum and foveal tissue were debrided.  I began reaming with a 47mm   reamer and reamed up to 53mm reamer with good bony bed preparation and a 54mm   cup was chosen.  The final 54mm Pinnacle cup was then impacted under fluoroscopy  to confirm  the depth of penetration and orientation with respect to   abduction.  A screw was placed followed by the hole eliminator.  The final   36+4 neutral Altrex liner was impacted with good visualized rim fit.  The cup was positioned anatomically within the acetabular portion of the pelvis.      At this point, the femur was rolled at 80 degrees.  Further capsule was   released off the inferior aspect of the femoral neck.  I then   released the superior capsule proximally.  The hook was placed laterally   along the femur and elevated manually and held in position with the bed   hook.  The leg was then extended and adducted with the leg rolled to 100   degrees of external rotation.  Once the proximal femur was fully   exposed, I used a box osteotome to set orientation.  I then began   broaching with the starting chili pepper broach and passed this by hand and then broached up to 6.  With the 6 broach in place I chose a high offset neck and did trial reductions.  Given his advanced arthritic changes to his right hip I intentionally did not match his new left hip length to that of the right due to the shortening through the arthritic changes in the joint.  But his offset seemed more appropriate, leg lengths   appeared to be close given above with the +5 head ball versus the +1.5, confirmed radiographically.   Given these findings, I went ahead and dislocated the hip, repositioned all   retractors and positioned the right hip in the extended and abducted position.  The final 6 Hi Tri Lock stem was   chosen and it was impacted down to the level of neck cut.  Based on this   and the trial reduction, a 36+5 delta ceramic ball was chosen and   impacted onto a clean and dry trunnion, and the hip was reduced.  The   hip had been irrigated throughout the case again at this point.  I did   reapproximate the superior capsular leaflet to the anterior leaflet   using #1 Vicryl.  The fascia of the   tensor fascia  lata muscle was then reapproximated using #1  Vicryl and #0 V-lock sutures.  The   remaining wound was closed with 2-0 Vicryl and running 4-0 Monocryl.   The hip was cleaned, dried, and dressed sterilely using Dermabond and   Aquacel dressing.  He was then brought   to recovery room in stable condition tolerating the procedure well.    Skip MayerBlair Roberts, PA-C was present for the entirety of the case involved from   preoperative positioning, perioperative retractor management, general   facilitation of the case, as well as primary wound closure as assistant.            Madlyn FrankelMatthew D. Charlann Boxerlin, M.D.        04/04/2014 2:31 PM

## 2014-04-05 LAB — BASIC METABOLIC PANEL
ANION GAP: 11 (ref 5–15)
BUN: 15 mg/dL (ref 6–23)
CO2: 23 mEq/L (ref 19–32)
Calcium: 8.6 mg/dL (ref 8.4–10.5)
Chloride: 103 mEq/L (ref 96–112)
Creatinine, Ser: 0.97 mg/dL (ref 0.50–1.35)
Glucose, Bld: 127 mg/dL — ABNORMAL HIGH (ref 70–99)
POTASSIUM: 4.1 meq/L (ref 3.7–5.3)
SODIUM: 137 meq/L (ref 137–147)

## 2014-04-05 LAB — CBC
HCT: 35.5 % — ABNORMAL LOW (ref 39.0–52.0)
HEMOGLOBIN: 12.2 g/dL — AB (ref 13.0–17.0)
MCH: 32.1 pg (ref 26.0–34.0)
MCHC: 34.4 g/dL (ref 30.0–36.0)
MCV: 93.4 fL (ref 78.0–100.0)
PLATELETS: 167 10*3/uL (ref 150–400)
RBC: 3.8 MIL/uL — ABNORMAL LOW (ref 4.22–5.81)
RDW: 12.3 % (ref 11.5–15.5)
WBC: 11.9 10*3/uL — ABNORMAL HIGH (ref 4.0–10.5)

## 2014-04-05 MED ORDER — ASPIRIN 325 MG PO TBEC: 325.0000 mg | DELAYED_RELEASE_TABLET | Freq: Two times a day (BID) | ORAL | Status: DC

## 2014-04-05 MED ORDER — METHOCARBAMOL 500 MG PO TABS: 500.0000 mg | ORAL_TABLET | Freq: Four times a day (QID) | ORAL | Status: DC | PRN

## 2014-04-05 MED ORDER — FERROUS SULFATE 325 (65 FE) MG PO TABS: 325.0000 mg | ORAL_TABLET | Freq: Three times a day (TID) | ORAL | Status: DC

## 2014-04-05 MED ORDER — HYDROCODONE-ACETAMINOPHEN 7.5-325 MG PO TABS: 1.0000 | ORAL_TABLET | ORAL | Status: DC | PRN

## 2014-04-05 MED ORDER — DSS 100 MG PO CAPS: 100.0000 mg | ORAL_CAPSULE | Freq: Two times a day (BID) | ORAL | Status: DC

## 2014-04-05 MED ORDER — POLYETHYLENE GLYCOL 3350 17 G PO PACK: 17.0000 g | PACK | Freq: Two times a day (BID) | ORAL | Status: DC

## 2014-04-05 NOTE — Evaluation (Signed)
Physical Therapy Evaluation Patient Details Name: Mike BrownsJames P Kneale MRN: [id number] DOB: 07/01/1960 Today's Date: 04/05/2014   History of Present Illness  L DATHA, plans for R DATHA in 3 weeks.  Clinical Impression  Patient had no dizziness during mobility today. Patient will benefit from PT to address problems listed in note below.    Follow Up Recommendations Home health PT;Supervision - Intermittent    Equipment Recommendations  Rolling walker with 5" wheels;Crutches    Recommendations for Other Services       Precautions / Restrictions Precautions Precautions: Fall      Mobility  Bed Mobility Overal bed mobility: Needs Assistance Bed Mobility: Supine to Sit     Supine to sit: Min assist;HOB elevated     General bed mobility comments: cues for technique  Transfers Overall transfer level: Needs assistance Equipment used: Rolling walker (2 wheeled) Transfers: Sit to/from Stand Sit to Stand: Min assist         General transfer comment: cues for hand placement, L leg position for sitting  Ambulation/Gait Ambulation/Gait assistance: Min assist Ambulation Distance (Feet): 300 Feet Assistive device: Rolling walker (2 wheeled) Gait Pattern/deviations: Step-to pattern;Step-through pattern;Decreased step length - left;Decreased stance time - left     General Gait Details: cues for sequence and  attempts to ER the leg during swing.  Stairs            Wheelchair Mobility    Modified Rankin (Stroke Patients Only)       Balance                                             Pertinent Vitals/Pain Pain Assessment: 0-10 Pain Score: 5  Pain Location: L thigh Pain Descriptors / Indicators: Aching;Tightness Pain Intervention(s): Monitored during session;Premedicated before session;Repositioned;Ice applied    Home Living Family/patient expects to be discharged to:: Private residence Living Arrangements: Spouse/significant other Available  Help at Discharge: Family Type of Home: House Home Access: Stairs to enter Entrance Stairs-Rails: None Secretary/administratorntrance Stairs-Number of Steps: 2 Home Layout: One level Home Equipment: None Additional Comments: bedroom toilet is comfort height    Prior Function Level of Independence: Independent               Hand Dominance        Extremity/Trunk Assessment               Lower Extremity Assessment: LLE deficits/detail   LLE Deficits / Details: decreased ability to advance Leg, noted for tendency to IR hip, encouraged to externally rotate.     Communication   Communication: No difficulties  Cognition Arousal/Alertness: Awake/alert Behavior During Therapy: WFL for tasks assessed/performed Overall Cognitive Status: Within Functional Limits for tasks assessed                      General Comments      Exercises Total Joint Exercises Quad Sets: AROM;Left;10 reps;Supine Short Arc Quad: AROM;Left;10 reps;Supine Heel Slides: AAROM;Left;10 reps;Supine Hip ABduction/ADduction: AAROM;Left;10 reps;Supine Long Arc Quad: AROM;Left;Supine      Assessment/Plan    PT Assessment Patient needs continued PT services  PT Diagnosis Difficulty walking;Acute pain   PT Problem List Decreased strength;Decreased range of motion;Decreased activity tolerance;Decreased mobility;Decreased knowledge of precautions;Decreased safety awareness;Decreased knowledge of use of DME  PT Treatment Interventions Gait training;DME instruction;Stair training;Functional mobility training;Therapeutic activities;Therapeutic exercise;Patient/family education   PT Goals (  Current goals can be found in the Care Plan section) Acute Rehab PT Goals Patient Stated Goal: to  get the R hip done PT Goal Formulation: With patient/family Time For Goal Achievement: 04/07/14 Potential to Achieve Goals: Good    Frequency 7X/week   Barriers to discharge        Co-evaluation               End of  Session   Activity Tolerance: Patient tolerated treatment well Patient left: in chair;with call bell/phone within reach;with family/visitor present Nurse Communication: Mobility status         Time: [id number] PT Time Calculation (min) (ACUTE ONLY): 39 min   Charges:   PT Evaluation $Initial PT Evaluation Tier I: 1 Procedure PT Treatments $Gait Training: 8-22 mins $Therapeutic Exercise: 8-22 mins $Self Care/Home Management: 8-22   PT G Codes:          Rada HayHill, Tashonna Descoteaux Elizabeth 04/05/2014, 12:24 PM Blanchard KelchKaren Laelyn Blumenthal PT [phone]

## 2014-04-05 NOTE — Progress Notes (Signed)
Patient discharged to home.  Reviewed discharge paperwork with patient and wife, both verbalized understanding.  Prescriptions given to patient.  Patient escorted from unit via wheelchair with NT.  Dorothyann PengKim Haadiya Frogge RN

## 2014-04-05 NOTE — Care Management Note (Addendum)
    Page 1 of 2   04/05/2014     10:54:00 AM CARE MANAGEMENT NOTE 04/05/2014  Patient:  JAYMOND, WAAGE   Account Number:  [id number]  Date Initiated:  04/05/2014  Documentation initiated by:  [facility]  Subjective/Objective Assessment:   adm: LEFT TOTAL HIP ARTHROPLASTY ANTERIOR APPROACH (Left)     Action/Plan:   discharge planning   Anticipated DC Date:  04/05/2014   Anticipated DC Plan:  [place]  CM consult      [facility] Choice  HOME HEALTH   Choice offered to / List presented to:  C-1 Patient   DME arranged  3-N-1  [place]      DME agency  [place] arranged  [place]   Status of service:  Completed, signed off Medicare Important Message given?   (If response is "NO", the following Medicare IM given date fields will be blank) Date Medicare IM given:   Medicare IM given by:   Date Additional Medicare IM given:   Additional Medicare IM given by:    Discharge Disposition:  [place]  Per UR Regulation:    If discussed at Long Length of Stay Meetings, dates discussed:    Comments:  04/05/14 10:40 CM met with pt in room to offer choice of home health agency.  Pt chooses [facility] to render HHPT. Address and contact informaiton verified with pt.  [facility] DME rep, Pura Spice called to please deliver 3n1 to room prior to discharge. Referral called to [facility].  No other CM needs were communicated.  Mariane Masters, BSN, CM [phone].

## 2014-04-05 NOTE — Progress Notes (Signed)
Physical Therapy Treatment Patient Details Name: Mike Mccann MRN: [id number] DOB: 11/17/1960 Today's Date: 04/05/2014    History of Present Illness L DATHA, plans for R DATHA in 3 weeks.    PT Comments    Patient doing much better with L leg control. Ready form DC.  Follow Up Recommendations  Home health PT;Supervision - Intermittent     Equipment Recommendations  Rolling walker with 5" wheels;Crutches    Recommendations for Other Services       Precautions / Restrictions Precautions Precautions: Fall Restrictions Weight Bearing Restrictions: No    Mobility  Bed Mobility Overal bed mobility: Needs Assistance Bed Mobility: Supine to Sit;Sit to Supine     Supine to sit: Min guard Sit to supine: Min guard   General bed mobility comments: cues for technique  Transfers Overall transfer level: Needs assistance Equipment used: Rolling walker (2 wheeled) Transfers: Sit to/from Stand Sit to Stand: Supervision         General transfer comment: cues for hand placement, L leg position for sitting  Ambulation/Gait Ambulation/Gait assistance: Supervision Ambulation Distance (Feet): 300 Feet Assistive device: Crutches Gait Pattern/deviations: Step-through pattern     General Gait Details: cues for sequence and  attempts to ER the leg during swing.   Stairs Stairs: Yes     Number of Stairs: 2 General stair comments: practiced with 2 crutches then 1 crutch and 1 rail.  Wheelchair Mobility    Modified Rankin (Stroke Patients Only)       Balance                                    Cognition Arousal/Alertness: Awake/alert Behavior During Therapy: WFL for tasks assessed/performed Overall Cognitive Status: Within Functional Limits for tasks assessed                      Exercises Total Joint Exercises Quad Sets: AROM;Left;10 reps;Supine Short Arc Quad: AROM;Left;10 reps;Supine Heel Slides: AAROM;Left;10 reps;Supine Hip  ABduction/ADduction: AAROM;Left;10 reps;Supine Long Arc Quad: AROM;Left;Supine    General Comments        Pertinent Vitals/Pain Pain Assessment: 0-10 Pain Score: 4  Pain Location: L thigh Pain Descriptors / Indicators: Tightness Pain Intervention(s): Monitored during session;Patient requesting pain meds-RN notified    Home Living Family/patient expects to be discharged to:: Private residence Living Arrangements: Spouse/significant other Available Help at Discharge: Family Type of Home: House Home Access: Stairs to enter Entrance Stairs-Rails: None Home Layout: One level Home Equipment: None Additional Comments: all toilets low per wife; one is in a closet--may not be able to get walker in    Prior Function Level of Independence: Independent          PT Goals (current goals can now be found in the care plan section) Acute Rehab PT Goals Patient Stated Goal: to  get the R hip done PT Goal Formulation: With patient/family Time For Goal Achievement: 04/07/14 Potential to Achieve Goals: Good Progress towards PT goals: Progressing toward goals    Frequency  7X/week    PT Plan Current plan remains appropriate    Co-evaluation             End of Session   Activity Tolerance: Patient tolerated treatment well Patient left: with family/visitor present     Time: [id number] PT Time Calculation (min) (ACUTE ONLY): 25 min  Charges:  $Gait Training: 8-22 mins $Therapeutic Exercise: 8-22 mins $Self  Care/Home Management: 8-22                    G Codes:      Rada HayHill, Jaxx Huish Elizabeth 04/05/2014, 2:31 PM

## 2014-04-05 NOTE — Plan of Care (Signed)
Problem: Phase II Progression Outcomes Goal: Ambulates Outcome: Completed/Met Date Met:  04/05/14 Goal: Tolerating diet Outcome: Completed/Met Date Met:  04/05/14 Goal: Discharge plan established Outcome: Completed/Met Date Met:  04/05/14 Goal: Other Phase II Outcomes/Goals Outcome: Completed/Met Date Met:  04/05/14  Problem: Phase III Progression Outcomes Goal: Pain controlled on oral analgesia Outcome: Completed/Met Date Met:  04/05/14 Goal: Ambulates Outcome: Completed/Met Date Met:  04/05/14 Goal: Incision clean - minimal/no drainage Outcome: Completed/Met Date Met:  04/05/14 Goal: Discharge plan remains appropriate-arrangements made Outcome: Completed/Met Date Met:  04/05/14 Goal: Anticoagulant follow-up in place Outcome: Completed/Met Date Met:  04/05/14 Goal: Other Phase III Outcomes/Goals Outcome: Completed/Met Date Met:  04/05/14

## 2014-04-05 NOTE — Evaluation (Signed)
Occupational Therapy Evaluation Patient Details Name: Mike Mccann MRN: [id number] DOB: 08/25/1960 Today's Date: 04/05/2014    History of Present Illness L DATHA, plans for R DATHA in 3 weeks.   Clinical Impression   This 53 year old man was admitted for the above surgery.  All education was completed.  No further OT is needed at this time.    Follow Up Recommendations  No OT follow up    Equipment Recommendations  3 in 1 bedside comode    Recommendations for Other Services       Precautions / Restrictions Precautions Precautions: Fall Restrictions Weight Bearing Restrictions: No      Mobility Bed Mobility              Transfers Overall transfer level: Needs assistance Equipment used: Rolling walker (2 wheeled) Transfers: Sit to/from Stand Sit to Stand: Peter Kiewit SonsMin Guard          General transfer comment: cues for hand placement, L leg position for sitting  Pt did not have any dizziness--min guard for safety    Balance                                            ADL Overall ADL's : Needs assistance/impaired                     Lower Body Dressing: Minimal assistance;Sit to/from stand (for sock)   Toilet Transfer: PraxairMin Guard;Ambulation;BSC       Tub/ Shower Transfer: Psychologist, counsellingWalk-in shower;Ambulation;3 in 1;Min Guard      General ADL Comments: pt has purchased a reacher:  used to Qwest Communicationsdoff sock and simulate pants/washing with washcloth.  Wife will assist with sock.  Pt performed bathroom transfers at min guard level     Vision                     Perception     Praxis      Pertinent Vitals/Pain Pain Assessment: 0-10 Pain Score: 5  Pain Location: L thigh Pain Descriptors / Indicators: Aching;Tightness Pain Intervention(s): Monitored during session;Premedicated before session;Repositioned;Ice applied     Hand Dominance     Extremity/Trunk Assessment Upper Extremity Assessment Upper Extremity Assessment: Overall WFL for  tasks assessed          Communication Communication Communication: No difficulties   Cognition Arousal/Alertness: Awake/alert Behavior During Therapy: WFL for tasks assessed/performed Overall Cognitive Status: Within Functional Limits for tasks assessed                     General Comments       Exercises       Shoulder Instructions      Home Living Family/patient expects to be discharged to:: Private residence Living Arrangements: Spouse/significant other Available Help at Discharge: Family Type of Home: House Home Access: Stairs to enter Secretary/administratorntrance Stairs-Number of Steps: 2 Entrance Stairs-Rails: None Home Layout: One level     Bathroom Shower/Tub: Producer, television/film/videoWalk-in shower   Bathroom Toilet: Standard     Home Equipment: None   Additional Comments: all toilets low per wife; one is in a closet--may not be able to get walker in      Prior Functioning/Environment Level of Independence: Independent             OT Diagnosis: Acute pain   OT Problem List:  OT Treatment/Interventions:      OT Goals(Current goals can be found in the care plan section) Acute Rehab OT Goals Patient Stated Goal: to  get the R hip done  OT Frequency:     Barriers to D/C:            Co-evaluation              End of Session    Activity Tolerance: Patient tolerated treatment well Patient left: in chair;with call bell/phone within reach;with family/visitor present   Time: 1131-1148 OT Time Calculation (min): 17 min Charges:  OT General Charges $OT Visit: 1 Procedure OT Evaluation $Initial OT Evaluation Tier I: 1 Procedure OT Treatments $Self Care/Home Management : 8-22 mins G-Codes:    Rajendra Spiller 04/05/2014, 12:25 PM   Marica OtterMaryellen Deckard Stuber, OTR/L [phone] 04/05/2014

## 2014-04-05 NOTE — Plan of Care (Signed)
Problem: Discharge Progression Outcomes Goal: Barriers To Progression Addressed/Resolved Outcome: Completed/Met Date Met:  04/05/14 Goal: CMS/Neurovascular status at or above baseline Outcome: Completed/Met Date Met:  04/05/14 Goal: Anticoagulant follow-up in place Outcome: Completed/Met Date Met:  04/05/14 Goal: Pain controlled with appropriate interventions Outcome: Completed/Met Date Met:  04/05/14 Goal: Hemodynamically stable Outcome: Completed/Met Date Met:  [birth year] Goal: Complications resolved/controlled Outcome: Completed/Met Date Met:  04/05/14 Goal: Tolerates diet Outcome: Completed/Met Date Met:  04/05/14 Goal: Activity appropriate for discharge plan Outcome: Completed/Met Date Met:  04/05/14 Goal: Ambulates safely using assistive device Outcome: Completed/Met Date Met:  04/05/14 Goal: Follows weight - bearing limitations Outcome: Completed/Met Date Met:  04/05/14 Goal: Discharge plan in place and appropriate Outcome: Completed/Met Date Met:  04/05/14 Goal: Negotiates stairs Outcome: Completed/Met Date Met:  04/05/14 Goal: Demonstrates ADLs as appropriate Outcome: Completed/Met Date Met:  04/05/14 Goal: Incision without S/S infection Outcome: Completed/Met Date Met:  04/05/14 Goal: Other Discharge Outcomes/Goals Outcome: Completed/Met Date Met:  04/05/14

## 2014-04-05 NOTE — Progress Notes (Signed)
     Subjective: 1 Day Post-Op Procedure(s) (LRB): LEFT TOTAL HIP ARTHROPLASTY ANTERIOR APPROACH (Left)   Patient reports pain as mild, pain controlled. Little hypotension yesterday, but no events throughout the night. Ready to be discharged home.  Objective:   VITALS:   Filed Vitals:   04/05/14 0549  BP: 104/66  Pulse: 57  Temp: 98.4 F (36.9 C)  Resp: 16    Dorsiflexion/Plantar flexion intact Incision: dressing C/D/I No cellulitis present Compartment soft  LABS  Recent Labs  04/05/14 0415  HGB 12.2*  HCT 35.5*  WBC 11.9*  PLT 167     Recent Labs  04/05/14 0415  NA 137  K 4.1  BUN 15  CREATININE 0.97  GLUCOSE 127*     Assessment/Plan: 1 Day Post-Op Procedure(s) (LRB): LEFT TOTAL HIP ARTHROPLASTY ANTERIOR APPROACH (Left) Foley cath d/c'ed Advance diet Up with therapy D/C IV fluids Discharge home with home health  [facility]. Follow up with OLIN,Remy Voiles D in 2 weeks.  Contact information:  [facility] [address] [place] [id number] [phone]        Anastasio AuerbachMatthew S. Kemia Wendel   PAC  04/05/2014, 10:20 AM

## 2014-04-05 NOTE — Plan of Care (Signed)
Problem: Phase I Progression Outcomes Goal: Pain controlled with appropriate interventions Outcome: Completed/Met Date Met:  04/05/14 Goal: Dangle or out of bed evening of surgery Outcome: Completed/Met Date Met:  04/05/14 Goal: Hemodynamically stable Outcome: Completed/Met Date Met:  04/05/14 Goal: Other Phase I Outcomes/Goals Outcome: Completed/Met Date Met:  04/05/14

## 2014-04-06 ENCOUNTER — Encounter (HOSPITAL_COMMUNITY): Payer: Self-pay | Admitting: Orthopedic Surgery

## 2014-04-06 NOTE — Progress Notes (Signed)
Discharge summary sent to payer through [facility]  

## 2014-04-10 NOTE — Discharge Summary (Signed)
Physician Discharge Summary  Patient ID: Mike Mccann Nishikawa MRN: [id number] DOB/AGE: 54/01/1961 54 y.o.  Admit date: 04/04/2014 Discharge date: 04/05/2014   Procedures:  Procedure(s) (LRB): LEFT TOTAL HIP ARTHROPLASTY ANTERIOR APPROACH (Left)  Attending Physician:  Dr. Durene RomansMatthew Olin   Admission Diagnoses:   Left hip primary OA / pain  Discharge Diagnoses:  Principal Problem:   S/Mccann left THA, AA  Past Medical History  Diagnosis Date  . Tachycardia, paroxysmal     No true diagnosis by event monitor  . Dyslipidemia, goal LDL below 130     On statin.  . Abnormal finding on EKG October 2013     a) 2-D Echo, Mod RA& mild LA dilation, RVSP 33 mmHg. Nl LV function - EF > 55%. No valvular dz;; b) Myoview: ~ mild basal-mid inferoseptal ischemia: A low risk scan with excellent exercise capacity of 15 METS, exercising for 13 min, but horizontal ST depression in Lat leads;; c) repeat Myoview 02/2013: LOW RISK study w/ diaphragmatic attenuation   . Dysrhythmia     tachycardia  . Complication of anesthesia     given more medications than usual  . GERD (gastroesophageal reflux disease)   . Arthritis     HPI: Mike Mccann Neighbors, 53 y.o. male, has a history of pain and functional disability in the left hip(s) due to arthritis and patient has failed non-surgical conservative treatments for greater than 12 weeks to include NSAID's and/or analgesics and activity modification. Onset of symptoms was gradual starting 10 years ago with gradually worsening course since that time.The patient noted no past surgery on the left hip(s). Patient currently rates pain in the left hip at 8 out of 10 with activity. Patient has worsening of pain with activity and weight bearing, trendelenberg gait, pain that interfers with activities of daily living and pain with passive range of motion. Patient has evidence of periarticular osteophytes and joint space narrowing by imaging studies. This condition presents safety issues  increasing the risk of falls. There is no current active infection. Risks, benefits and expectations were discussed with the patient. Risks including but not limited to the risk of anesthesia, blood clots, nerve damage, blood vessel damage, failure of the prosthesis, infection and up to and including death. Patient understand the risks, benefits and expectations and wishes to proceed with surgery.   PCP: Ezequiel KayserPERINI,MARK A, MD   Discharged Condition: good  Hospital Course:  Patient underwent the above stated procedure on 04/04/2014. Patient tolerated the procedure well and brought to the recovery room in good condition and subsequently to the floor.  POD #1 BP: 104/66 ; Pulse: 57 ; Temp: 98.4 F (36.9 C) ; Resp: 16 Patient reports pain as mild, pain controlled. Little hypotension yesterday, but no events throughout the night. Ready to be discharged home. Dorsiflexion/plantar flexion intact, incision: dressing C/D/I, no cellulitis present and compartment soft.   LABS  Basename    HGB  12.2  HCT  35.5    Discharge Exam: General appearance: alert, cooperative and no distress Extremities: Homans sign is negative, no sign of DVT, no edema, redness or tenderness in the calves or thighs and no ulcers, gangrene or trophic changes  Disposition: Home with follow up in 2 weeks   Follow-up Information    [facility]   Why:  HOME HEALTH PHYSICAL THERAPY   Contact information:   [address] [place] [id number] [phone]       Follow up with Shelda PalLIN,Eland Lamantia D,  MD. Schedule an appointment as soon as possible for a visit in 2 weeks.   Specialty:  Orthopedic Surgery   Contact information:   [address] [place] [address] [phone]       Discharge Instructions    Call MD / Call 911    Complete by:  As directed   If you experience chest pain or shortness of breath, CALL 911 and be transported to the hospital emergency room.   If you develope a fever above 101 F, pus (white drainage) or increased drainage or redness at the wound, or calf pain, call your surgeon's office.     Change dressing    Complete by:  As directed   Maintain surgical dressing for 10-14 days, or until follow up in the clinic.     Constipation Prevention    Complete by:  As directed   Drink plenty of fluids.  Prune juice may be helpful.  You may use a stool softener, such as Colace (over the counter) 100 mg twice a day.  Use MiraLax (over the counter) for constipation as needed.     Diet - low sodium heart healthy    Complete by:  As directed      Discharge instructions    Complete by:  As directed   Maintain surgical dressing for 10-14 days, or until follow up in the clinic. [facility] in [facility]. Call with any questions or concerns.     Increase activity slowly as tolerated    Complete by:  As directed      TED hose    Complete by:  As directed   Use stockings (TED hose) for 2 weeks on both leg(s).  You may remove them at night for sleeping.     Weight bearing as tolerated    Complete by:  As directed   Laterality:  left  Extremity:  Lower             Medication List    STOP taking these medications        naproxen sodium 220 MG tablet  Commonly known as:  ANAPROX      TAKE these medications        aspirin 325 MG EC tablet  Take 1 tablet (325 mg total) by mouth 2 (two) times daily.     DSS 100 MG Caps  Take 100 mg by mouth 2 (two) times daily.     ferrous sulfate 325 (65 FE) MG tablet  Take 1 tablet (325 mg total) by mouth 3 (three) times daily after meals.     GLUCOSAMINE CHONDR COMPLEX PO  Take 1 tablet by mouth daily.     HYDROcodone-acetaminophen 7.5-325 MG per tablet  Commonly known as:  NORCO  Take 1-2 tablets by mouth every 4 (four) hours as needed for moderate pain.     methocarbamol 500 MG tablet  Commonly known as:  ROBAXIN  Take 1 tablet (500 mg total) by mouth every 6 (six)  hours as needed for muscle spasms.     omeprazole 20 MG tablet  Commonly known as:  PRILOSEC OTC  Take 20 mg by mouth daily.     polyethylene glycol packet  Commonly known as:  MIRALAX / GLYCOLAX  Take 17 g by mouth 2 (two) times daily.     simvastatin 20 MG tablet  Commonly known as:  ZOCOR  TAKE 1 TABLET BY MOUTH EVERY DAY AT 6 PM.  Signed: Anastasio AuerbachMatthew S. Zakery Normington   PA-C  04/10/2014, 3:26 PM

## 2014-04-19 NOTE — Progress Notes (Signed)
Please put orders in Epic surgery 05-02-14 pre op 04-25-14 Thanks 

## 2014-04-21 ENCOUNTER — Encounter (HOSPITAL_COMMUNITY): Payer: Self-pay

## 2014-04-24 NOTE — Patient Instructions (Signed)
[address] BrownsJames P Abril  04/24/2014   Your procedure is scheduled on: Tuesday 05/02/14  [facility] at 0515 AM.  Call this number if you have problems the morning of surgery [phone]: [phone]   Remember:   Do not eat food or drink liquids After Midnight.     Take these medicines the morning of surgery with A SIP OF WATER: omeprazole, simvastatin, hydrocodone if needed   Do not wear jewelry, make-up or nail polish.  Do not wear lotions, powders, or perfumes.   Do not shave 48 hours prior to surgery. Men may shave face and neck.  Do not bring valuables to the hospital.  Contacts, dentures or bridgework may not be worn into surgery.  Leave suitcase in the car. After surgery it may be brought to your room.  For patients admitted to the hospital, checkout time is 11:00 AM the day of discharge.   Please read over the following fact sheets that you were given: MRSA Information    [facility] - Preparing for Surgery Before surgery, you can play an important role.  Because skin is not sterile, your skin needs to be as free of germs as possible.  You can reduce the number of germs on your skin by washing with CHG (chlorahexidine gluconate) soap before surgery.  CHG is an antiseptic cleaner which kills germs and bonds with the skin to continue killing germs even after washing. Please DO NOT use if you have an allergy to CHG or antibacterial soaps.  If your skin becomes reddened/irritated stop using the CHG and inform your nurse when you arrive at Short Stay. Do not shave (including legs and underarms) for at least 48 hours prior to the first CHG shower.  You may shave your face/neck. Please follow these instructions carefully:  1.  Shower with CHG Soap the night before surgery and the  morning of Surgery.  2.  If you choose to wash your hair, wash your hair first as usual with your  normal  shampoo.  3.  After you shampoo, rinse your hair and body thoroughly to remove the   shampoo.                            4.  Use CHG as you would any other liquid soap.  You can apply chg directly  to the skin and wash                       Gently with a scrungie or clean washcloth.  5.  Apply the CHG Soap to your body ONLY FROM THE NECK DOWN.   Do not use on face/ open                           Wound or open sores. Avoid contact with eyes, ears mouth and genitals (private parts).                       Wash face,  Genitals (private parts) with your normal soap.             6.  Wash thoroughly, paying special attention to the area where your surgery  will be performed.  7.  Thoroughly rinse your body with warm water from the neck down.  8.  DO NOT shower/wash with your normal soap after using and rinsing  off  the CHG Soap.                9.  Pat yourself dry with a clean towel.            10.  Wear clean pajamas.            11.  Place clean sheets on your bed the night of your first shower and do not  sleep with pets. Day of Surgery : Do not apply any lotions/deodorants the morning of surgery.  Please wear clean clothes to the hospital/surgery center.  FAILURE TO FOLLOW THESE INSTRUCTIONS MAY RESULT IN THE CANCELLATION OF YOUR SURGERY PATIENT SIGNATURE_________________________________  NURSE SIGNATURE__________________________________  ________________________________________________________________________  WHAT IS A BLOOD TRANSFUSION? Blood Transfusion Information  A transfusion is the replacement of blood or some of its parts. Blood is made up of multiple cells which provide different functions.  Red blood cells carry oxygen and are used for blood loss replacement.  White blood cells fight against infection.  Platelets control bleeding.  Plasma helps clot blood.  Other blood products are available for specialized needs, such as hemophilia or other clotting disorders. BEFORE THE TRANSFUSION  Who gives blood for transfusions?   Healthy volunteers who are fully  evaluated to make sure their blood is safe. This is blood bank blood. Transfusion therapy is the safest it has ever been in the practice of medicine. Before blood is taken from a donor, a complete history is taken to make sure that person has no history of diseases nor engages in risky social behavior (examples are intravenous drug use or sexual activity with multiple partners). The donor's travel history is screened to minimize risk of transmitting infections, such as malaria. The donated blood is tested for signs of infectious diseases, such as HIV and hepatitis. The blood is then tested to be sure it is compatible with you in order to minimize the chance of a transfusion reaction. If you or a relative donates blood, this is often done in anticipation of surgery and is not appropriate for emergency situations. It takes many days to process the donated blood. RISKS AND COMPLICATIONS Although transfusion therapy is very safe and saves many lives, the main dangers of transfusion include:  1. Getting an infectious disease. 2. Developing a transfusion reaction. This is an allergic reaction to something in the blood you were given. Every precaution is taken to prevent this. The decision to have a blood transfusion has been considered carefully by your caregiver before blood is given. Blood is not given unless the benefits outweigh the risks. AFTER THE TRANSFUSION  Right after receiving a blood transfusion, you will usually feel much better and more energetic. This is especially true if your red blood cells have gotten low (anemic). The transfusion raises the level of the red blood cells which carry oxygen, and this usually causes an energy increase.  The nurse administering the transfusion will monitor you carefully for complications. HOME CARE INSTRUCTIONS  No special instructions are needed after a transfusion. You may find your energy is better. Speak with your caregiver about any limitations on activity  for underlying diseases you may have. SEEK MEDICAL CARE IF:   Your condition is not improving after your transfusion.  You develop redness or irritation at the intravenous (IV) site. SEEK IMMEDIATE MEDICAL CARE IF:  Any of the following symptoms occur over the next 12 hours:  Shaking chills.  You have a temperature by mouth above 102 F (38.9 C), not  controlled by medicine.  Chest, back, or muscle pain.  People around you feel you are not acting correctly or are confused.  Shortness of breath or difficulty breathing.  Dizziness and fainting.  You get a rash or develop hives.  You have a decrease in urine output.  Your urine turns a dark color or changes to pink, red, or brown. Any of the following symptoms occur over the next 10 days:  You have a temperature by mouth above 102 F (38.9 C), not controlled by medicine.  Shortness of breath.  Weakness after normal activity.  The white part of the eye turns yellow (jaundice).  You have a decrease in the amount of urine or are urinating less often.  Your urine turns a dark color or changes to pink, red, or brown. Document Released: 04/18/2000 Document Revised: 07/14/2011 Document Reviewed: 12/06/2007 ExitCare Patient Information 2014 [place].  _______________________________________________________________________  Incentive Spirometer  An incentive spirometer is a tool that can help keep your lungs clear and active. This tool measures how well you are filling your lungs with each breath. Taking long deep breaths may help reverse or decrease the chance of developing breathing (pulmonary) problems (especially infection) following:  A long period of time when you are unable to move or be active. BEFORE THE PROCEDURE   If the spirometer includes an indicator to show your best effort, your nurse or respiratory therapist will set it to a desired goal.  If possible, sit up straight or lean slightly forward. Try not  to slouch.  Hold the incentive spirometer in an upright position. INSTRUCTIONS FOR USE  3. Sit on the edge of your bed if possible, or sit up as far as you can in bed or on a chair. 4. Hold the incentive spirometer in an upright position. 5. Breathe out normally. 6. Place the mouthpiece in your mouth and seal your lips tightly around it. 7. Breathe in slowly and as deeply as possible, raising the piston or the ball toward the top of the column. 8. Hold your breath for 3-5 seconds or for as long as possible. Allow the piston or ball to fall to the bottom of the column. 9. Remove the mouthpiece from your mouth and breathe out normally. 10. Rest for a few seconds and repeat Steps 1 through 7 at least 10 times every 1-2 hours when you are awake. Take your time and take a few normal breaths between deep breaths. 11. The spirometer may include an indicator to show your best effort. Use the indicator as a goal to work toward during each repetition. 12. After each set of 10 deep breaths, practice coughing to be sure your lungs are clear. If you have an incision (the cut made at the time of surgery), support your incision when coughing by placing a pillow or rolled up towels firmly against it. Once you are able to get out of bed, walk around indoors and cough well. You may stop using the incentive spirometer when instructed by your caregiver.  RISKS AND COMPLICATIONS  Take your time so you do not get dizzy or light-headed.  If you are in pain, you may need to take or ask for pain medication before doing incentive spirometry. It is harder to take a deep breath if you are having pain. AFTER USE  Rest and breathe slowly and easily.  It can be helpful to keep track of a log of your progress. Your caregiver can provide you with a simple table to  help with this. If you are using the spirometer at home, follow these instructions: [place] IF:   You are having difficultly using the  spirometer.  You have trouble using the spirometer as often as instructed.  Your pain medication is not giving enough relief while using the spirometer.  You develop fever of 100.5 F (38.1 C) or higher. SEEK IMMEDIATE MEDICAL CARE IF:   You cough up bloody sputum that had not been present before.  You develop fever of 102 F (38.9 C) or greater.  You develop worsening pain at or near the incision site. MAKE SURE YOU:   Understand these instructions.  Will watch your condition.  Will get help right away if you are not doing well or get worse. Document Released: 09/01/2006 Document Revised: 07/14/2011 Document Reviewed: 11/02/2006 [facility] Patient Information 2014 [place], [place].   ________________________________________________________________________

## 2014-04-24 NOTE — Progress Notes (Signed)
EKG 02/02/14 on EPIC

## 2014-04-25 ENCOUNTER — Encounter (HOSPITAL_COMMUNITY): Payer: Self-pay

## 2014-04-25 ENCOUNTER — Encounter (HOSPITAL_COMMUNITY)
Admission: RE | Admit: 2014-04-25 | Discharge: 2014-04-25 | Disposition: A | Discharge status: Home / Self Care | Payer: BC Managed Care – PPO | Source: Ambulatory Visit | Attending: Orthopedic Surgery | Admitting: Orthopedic Surgery

## 2014-04-25 DIAGNOSIS — Z01812 Encounter for preprocedural laboratory examination: Secondary | ICD-10-CM | POA: Diagnosis not present

## 2014-04-25 LAB — URINALYSIS, ROUTINE W REFLEX MICROSCOPIC
BILIRUBIN URINE: NEGATIVE
Glucose, UA: NEGATIVE mg/dL
HGB URINE DIPSTICK: NEGATIVE
KETONES UR: NEGATIVE mg/dL
Leukocytes, UA: NEGATIVE
Nitrite: NEGATIVE
PROTEIN: NEGATIVE mg/dL
Specific Gravity, Urine: 1.023 (ref 1.005–1.030)
UROBILINOGEN UA: 0.2 mg/dL (ref 0.0–1.0)
pH: 5 (ref 5.0–8.0)

## 2014-04-25 LAB — BASIC METABOLIC PANEL
ANION GAP: 6 (ref 5–15)
BUN: 24 mg/dL — ABNORMAL HIGH (ref 6–23)
CALCIUM: 9.2 mg/dL (ref 8.4–10.5)
CO2: 29 mmol/L (ref 19–32)
Chloride: 104 mEq/L (ref 96–112)
Creatinine, Ser: 0.95 mg/dL (ref 0.50–1.35)
Glucose, Bld: 100 mg/dL — ABNORMAL HIGH (ref 70–99)
Potassium: 4.6 mmol/L (ref 3.5–5.1)
SODIUM: 139 mmol/L (ref 135–145)

## 2014-04-25 LAB — CBC
HEMATOCRIT: 38.6 % — AB (ref 39.0–52.0)
Hemoglobin: 12.7 g/dL — ABNORMAL LOW (ref 13.0–17.0)
MCH: 31.6 pg (ref 26.0–34.0)
MCHC: 32.9 g/dL (ref 30.0–36.0)
MCV: 96 fL (ref 78.0–100.0)
PLATELETS: 305 10*3/uL (ref 150–400)
RBC: 4.02 MIL/uL — ABNORMAL LOW (ref 4.22–5.81)
RDW: 12.5 % (ref 11.5–15.5)
WBC: 4.4 10*3/uL (ref 4.0–10.5)

## 2014-04-25 LAB — SURGICAL PCR SCREEN
MRSA, PCR: NEGATIVE
STAPHYLOCOCCUS AUREUS: NEGATIVE

## 2014-04-25 LAB — PROTIME-INR
INR: 1.14 (ref 0.00–1.49)
PROTHROMBIN TIME: 14.7 s (ref 11.6–15.2)

## 2014-04-25 LAB — APTT: aPTT: 36 seconds (ref 24–37)

## 2014-04-30 NOTE — H&P (Signed)
TOTAL HIP ADMISSION H&P  Patient is admitted for right total hip arthroplasty, anterior approach.  Subjective:  Chief Complaint:   Right hip primary OA / pain  HPI: Mike Mccann, 53 y.o. male, has a history of pain and functional disability in the right hip(s) due to arthritis and patient has failed non-surgical conservative treatments for greater than 12 weeks to include NSAID's and/or analgesics and activity modification.  Onset of symptoms was gradual starting >10 years ago with gradually worsening course since that time.The patient noted prior procedures of the hip to include arthroplasty on the left hip on 04/04/2014 per Dr. Charlann Boxerlin.  Patient currently rates pain in the right hip at 7 out of 10 with activity. Patient has worsening of pain with activity and weight bearing, trendelenberg gait, pain that interfers with activities of daily living and pain with passive range of motion. Patient has evidence of periarticular osteophytes and joint space narrowing by imaging studies. This condition presents safety issues increasing the risk of falls.  There is no current active infection.  Risks, benefits and expectations were discussed with the patient.  Risks including but not limited to the risk of anesthesia, blood clots, nerve damage, blood vessel damage, failure of the prosthesis, infection and up to and including death.  Patient understand the risks, benefits and expectations and wishes to proceed with surgery.   PCP: Ezequiel KayserPERINI,MARK A, MD  D/C Plans:      Home with HHPT  Post-op Meds:       No Rx given  Tranexamic Acid:      To be given - IV    Decadron:      Is to be given  FYI:     ASA post-op  Norco post-op    Patient Active Problem List   Diagnosis Date Noted  . S/P left THA, AA 04/04/2014  . Preoperative cardiovascular examination 02/02/2014  . Tachycardia, paroxysmal   . Dyslipidemia, goal LDL below 130    Past Medical History  Diagnosis Date  . Tachycardia, paroxysmal     No  true diagnosis by event monitor  . Dyslipidemia, goal LDL below 130     On statin, "ratio is really off"  . Abnormal finding on EKG October 2013     a) 2-D Echo, Mod RA& mild LA dilation, RVSP 33 mmHg. Nl LV function - EF > 55%. No valvular dz;; b) Myoview: ~ mild basal-mid inferoseptal ischemia: A low risk scan with excellent exercise capacity of 15 METS, exercising for 13 min, but horizontal ST depression in Lat leads;; c) repeat Myoview 02/2013: LOW RISK study w/ diaphragmatic attenuation   . GERD (gastroesophageal reflux disease)   . Dysrhythmia     tachycardia  . Arthritis     hip  . Complication of anesthesia     given more medications than usual-"unsure if that happend"    Past Surgical History  Procedure Laterality Date  . Wisdom tooth extraction  10 years ago  . Colonoscopy  2 years ago  . Vasectomy  8 years ago  . Total hip arthroplasty Left 04/04/2014    Procedure: LEFT TOTAL HIP ARTHROPLASTY ANTERIOR APPROACH;  Surgeon: Shelda PalMatthew D Olin, MD;  Location: [facility];  Service: Orthopedics;  Laterality: Left;    No prescriptions prior to admission   No Known Allergies   History  Substance Use Topics  . Smoking status: Never Smoker   . Smokeless tobacco: Never Used  . Alcohol Use: Yes     Comment: wine  once a month    No family history on file.   Review of Systems  Constitutional: Negative.   HENT: Negative.   Eyes: Negative.   Respiratory: Negative.   Cardiovascular: Negative.   Gastrointestinal: Positive for heartburn.  Genitourinary: Negative.   Musculoskeletal: Positive for joint pain.  Skin: Negative.   Neurological: Negative.   Endo/Heme/Allergies: Negative.   Psychiatric/Behavioral: Negative.     Objective:  Physical Exam  Constitutional: He is oriented to person, place, and time. He appears well-developed and well-nourished.  HENT:  Head: Normocephalic and atraumatic.  Eyes: Pupils are equal, round, and reactive to light.  Neck: Neck supple. No JVD  present. No tracheal deviation present. No thyromegaly present.  Cardiovascular: Normal rate, regular rhythm, normal heart sounds and intact distal pulses.   Respiratory: Effort normal and breath sounds normal. No stridor. No respiratory distress. He has no wheezes.  GI: Soft. There is no tenderness. There is no guarding.  Musculoskeletal:       Right hip: He exhibits decreased range of motion, decreased strength, tenderness and bony tenderness. He exhibits no swelling, no deformity and no laceration.  Lymphadenopathy:    He has no cervical adenopathy.  Neurological: He is alert and oriented to person, place, and time.  Skin: Skin is warm and dry.  Psychiatric: He has a normal mood and affect.       Labs:  Estimated body mass index is 23.58 kg/(m^2) as calculated from the following:   Height as of 02/02/13: 5\' 11"  (1.803 m).   Weight as of 04/04/14: 76.658 kg (169 lb).   Imaging Review Plain radiographs demonstrate severe degenerative joint disease of the right hip(s). The bone quality appears to be good for age and reported activity level.  Assessment/Plan:  End stage arthritis, right hip(s)  The patient history, physical examination, clinical judgement of the provider and imaging studies are consistent with end stage degenerative joint disease of the right hip(s) and total hip arthroplasty is deemed medically necessary. The treatment options including medical management, injection therapy, arthroscopy and arthroplasty were discussed at length. The risks and benefits of total hip arthroplasty were presented and reviewed. The risks due to aseptic loosening, infection, stiffness, dislocation/subluxation,  thromboembolic complications and other imponderables were discussed.  The patient acknowledged the explanation, agreed to proceed with the plan and consent was signed. Patient is being admitted for inpatient treatment for surgery, pain control, PT, OT, prophylactic antibiotics, VTE  prophylaxis, progressive ambulation and ADL's and discharge planning.The patient is planning to be discharged home with home health services.      Anastasio AuerbachMatthew S. Clovia Reine   PA-C  04/30/2014, 12:49 PM

## 2014-05-02 ENCOUNTER — Encounter (HOSPITAL_COMMUNITY): Payer: Self-pay | Admitting: *Deleted

## 2014-05-02 ENCOUNTER — Inpatient Hospital Stay (HOSPITAL_COMMUNITY): Payer: BC Managed Care – PPO | Admitting: Anesthesiology

## 2014-05-02 ENCOUNTER — Inpatient Hospital Stay (HOSPITAL_COMMUNITY): Payer: BC Managed Care – PPO

## 2014-05-02 ENCOUNTER — Inpatient Hospital Stay (HOSPITAL_COMMUNITY)
Admission: RE | Admit: 2014-05-02 | Discharge: 2014-05-03 | DRG: 470 | Disposition: A | Discharge status: Home / Self Care | Payer: BC Managed Care – PPO | Source: Ambulatory Visit | Attending: Orthopedic Surgery | Admitting: Orthopedic Surgery

## 2014-05-02 ENCOUNTER — Encounter (HOSPITAL_COMMUNITY)
Admission: RE | Disposition: A | Discharge status: Home / Self Care | Payer: Self-pay | Source: Ambulatory Visit | Attending: Orthopedic Surgery

## 2014-05-02 DIAGNOSIS — E785 Hyperlipidemia, unspecified: Secondary | ICD-10-CM | POA: Diagnosis present

## 2014-05-02 DIAGNOSIS — K219 Gastro-esophageal reflux disease without esophagitis: Secondary | ICD-10-CM | POA: Diagnosis present

## 2014-05-02 DIAGNOSIS — M1611 Unilateral primary osteoarthritis, right hip: Secondary | ICD-10-CM | POA: Diagnosis present

## 2014-05-02 DIAGNOSIS — Z96642 Presence of left artificial hip joint: Secondary | ICD-10-CM | POA: Diagnosis present

## 2014-05-02 DIAGNOSIS — M25561 Pain in right knee: Secondary | ICD-10-CM | POA: Diagnosis present

## 2014-05-02 DIAGNOSIS — Z96649 Presence of unspecified artificial hip joint: Secondary | ICD-10-CM

## 2014-05-02 HISTORY — PX: TOTAL HIP ARTHROPLASTY: SHX124

## 2014-05-02 LAB — TYPE AND SCREEN
ABO/RH(D): A NEG
Antibody Screen: NEGATIVE

## 2014-05-02 LAB — GLUCOSE, CAPILLARY: GLUCOSE-CAPILLARY: 142 mg/dL — AB (ref 70–99)

## 2014-05-02 SURGERY — ARTHROPLASTY, HIP, TOTAL, ANTERIOR APPROACH
Anesthesia: Spinal | Site: Hip | Laterality: Right

## 2014-05-02 MED ORDER — PROMETHAZINE HCL 25 MG/ML IJ SOLN: 6.2500 mg | INTRAMUSCULAR | Status: DC | PRN

## 2014-05-02 MED ORDER — SODIUM CHLORIDE 0.9 % IV SOLN
100.0000 mL/h | INTRAVENOUS | Status: DC
  Administered 2014-05-02 (×2): 100 mL/h via INTRAVENOUS
  Filled 2014-05-02 (×5): qty 1000

## 2014-05-02 MED ORDER — PANTOPRAZOLE SODIUM 40 MG PO TBEC
40.0000 mg | DELAYED_RELEASE_TABLET | Freq: Every day | ORAL | Status: DC
  Administered 2014-05-03: 40 mg via ORAL
  Filled 2014-05-02: qty 1

## 2014-05-02 MED ORDER — METOCLOPRAMIDE HCL 5 MG/ML IJ SOLN
5.0000 mg | Freq: Three times a day (TID) | INTRAMUSCULAR | Status: DC | PRN
  Administered 2014-05-02: 10 mg via INTRAVENOUS
  Filled 2014-05-02: qty 2

## 2014-05-02 MED ORDER — SODIUM CHLORIDE 0.9 % IR SOLN
Status: DC | PRN
  Administered 2014-05-02: 1000 mL

## 2014-05-02 MED ORDER — PREGABALIN 75 MG PO CAPS
75.0000 mg | ORAL_CAPSULE | Freq: Two times a day (BID) | ORAL | Status: DC
  Administered 2014-05-02 – 2014-05-03 (×2): 75 mg via ORAL
  Filled 2014-05-02 (×2): qty 1

## 2014-05-02 MED ORDER — FERROUS SULFATE 325 (65 FE) MG PO TABS
325.0000 mg | ORAL_TABLET | Freq: Three times a day (TID) | ORAL | Status: DC
  Administered 2014-05-03 (×2): 325 mg via ORAL
  Filled 2014-05-02 (×6): qty 1

## 2014-05-02 MED ORDER — ALUM & MAG HYDROXIDE-SIMETH 200-200-20 MG/5ML PO SUSP: 30.0000 mL | ORAL | Status: DC | PRN

## 2014-05-02 MED ORDER — MEPERIDINE HCL 50 MG/ML IJ SOLN: 6.2500 mg | INTRAMUSCULAR | Status: DC | PRN

## 2014-05-02 MED ORDER — SODIUM CHLORIDE 0.9 % IV BOLUS (SEPSIS)
500.0000 mL | Freq: Once | INTRAVENOUS | Status: AC
  Administered 2014-05-02: 500 mL via INTRAVENOUS

## 2014-05-02 MED ORDER — PROPOFOL INFUSION 10 MG/ML OPTIME
INTRAVENOUS | Status: DC | PRN
  Administered 2014-05-02: 75 ug/kg/min via INTRAVENOUS

## 2014-05-02 MED ORDER — BUPIVACAINE HCL (PF) 0.75 % IJ SOLN
INTRAMUSCULAR | Status: DC | PRN
Start: 2014-05-02 — End: 2014-05-02
  Administered 2014-05-02: 2 mL via INTRATHECAL

## 2014-05-02 MED ORDER — FENTANYL CITRATE 0.05 MG/ML IJ SOLN
INTRAMUSCULAR | Status: AC
  Filled 2014-05-02: qty 2

## 2014-05-02 MED ORDER — MENTHOL 3 MG MT LOZG
1.0000 | LOZENGE | OROMUCOSAL | Status: DC | PRN
  Filled 2014-05-02: qty 9

## 2014-05-02 MED ORDER — TRANEXAMIC ACID 100 MG/ML IV SOLN
1000.0000 mg | Freq: Once | INTRAVENOUS | Status: AC
  Administered 2014-05-02: 1000 mg via INTRAVENOUS
  Filled 2014-05-02: qty 10

## 2014-05-02 MED ORDER — FENTANYL CITRATE 0.05 MG/ML IJ SOLN
INTRAMUSCULAR | Status: DC | PRN
  Administered 2014-05-02: 100 ug via INTRAVENOUS

## 2014-05-02 MED ORDER — MIDAZOLAM HCL 2 MG/2ML IJ SOLN
INTRAMUSCULAR | Status: AC
  Filled 2014-05-02: qty 2

## 2014-05-02 MED ORDER — PROPOFOL 10 MG/ML IV BOLUS
INTRAVENOUS | Status: DC | PRN
  Administered 2014-05-02 (×2): 10 mg via INTRAVENOUS

## 2014-05-02 MED ORDER — ONDANSETRON HCL 4 MG/2ML IJ SOLN: 4.0000 mg | Freq: Four times a day (QID) | INTRAMUSCULAR | Status: DC | PRN

## 2014-05-02 MED ORDER — LACTATED RINGERS IV SOLN: INTRAVENOUS | Status: DC

## 2014-05-02 MED ORDER — BISACODYL 10 MG RE SUPP: 10.0000 mg | Freq: Every day | RECTAL | Status: DC | PRN

## 2014-05-02 MED ORDER — DOCUSATE SODIUM 100 MG PO CAPS
100.0000 mg | ORAL_CAPSULE | Freq: Two times a day (BID) | ORAL | Status: DC
  Administered 2014-05-02 – 2014-05-03 (×3): 100 mg via ORAL

## 2014-05-02 MED ORDER — TRAMADOL HCL 50 MG PO TABS
50.0000 mg | ORAL_TABLET | Freq: Four times a day (QID) | ORAL | Status: DC | PRN
  Administered 2014-05-03 (×3): 50 mg via ORAL
  Filled 2014-05-02 (×3): qty 1

## 2014-05-02 MED ORDER — POLYETHYLENE GLYCOL 3350 17 G PO PACK
17.0000 g | PACK | Freq: Two times a day (BID) | ORAL | Status: DC
  Administered 2014-05-02 – 2014-05-03 (×3): 17 g via ORAL

## 2014-05-02 MED ORDER — DEXAMETHASONE SODIUM PHOSPHATE 10 MG/ML IJ SOLN
10.0000 mg | Freq: Once | INTRAMUSCULAR | Status: AC
  Administered 2014-05-02: 10 mg via INTRAVENOUS

## 2014-05-02 MED ORDER — ASPIRIN EC 325 MG PO TBEC
325.0000 mg | DELAYED_RELEASE_TABLET | Freq: Two times a day (BID) | ORAL | Status: DC
  Administered 2014-05-03: 325 mg via ORAL
  Filled 2014-05-02 (×3): qty 1

## 2014-05-02 MED ORDER — METHOCARBAMOL 500 MG PO TABS
500.0000 mg | ORAL_TABLET | Freq: Four times a day (QID) | ORAL | Status: DC | PRN
  Administered 2014-05-02 – 2014-05-03 (×4): 500 mg via ORAL
  Filled 2014-05-02 (×4): qty 1

## 2014-05-02 MED ORDER — CEFAZOLIN SODIUM-DEXTROSE 2-3 GM-% IV SOLR
INTRAVENOUS | Status: AC
  Filled 2014-05-02: qty 50

## 2014-05-02 MED ORDER — MAGNESIUM CITRATE PO SOLN: 1.0000 | Freq: Once | ORAL | Status: AC | PRN

## 2014-05-02 MED ORDER — LACTATED RINGERS IV SOLN
INTRAVENOUS | Status: DC | PRN
  Administered 2014-05-02 (×2): via INTRAVENOUS

## 2014-05-02 MED ORDER — HYDROMORPHONE HCL 1 MG/ML IJ SOLN: 0.2500 mg | INTRAMUSCULAR | Status: DC | PRN

## 2014-05-02 MED ORDER — HYDROMORPHONE HCL 1 MG/ML IJ SOLN
0.5000 mg | INTRAMUSCULAR | Status: DC | PRN
  Administered 2014-05-02: 1 mg via INTRAVENOUS
  Administered 2014-05-02: 0.5 mg via INTRAVENOUS
  Filled 2014-05-02 (×2): qty 1

## 2014-05-02 MED ORDER — HYDROCODONE-ACETAMINOPHEN 7.5-325 MG PO TABS
1.0000 | ORAL_TABLET | ORAL | Status: DC
  Administered 2014-05-02: 1 via ORAL
  Administered 2014-05-02: 2 via ORAL
  Administered 2014-05-02: 1 via ORAL
  Filled 2014-05-02: qty 1
  Filled 2014-05-02 (×2): qty 2

## 2014-05-02 MED ORDER — CEFAZOLIN SODIUM-DEXTROSE 2-3 GM-% IV SOLR
2.0000 g | Freq: Four times a day (QID) | INTRAVENOUS | Status: AC
  Administered 2014-05-02 (×2): 2 g via INTRAVENOUS
  Filled 2014-05-02 (×2): qty 50

## 2014-05-02 MED ORDER — PHENOL 1.4 % MT LIQD: 1.0000 | OROMUCOSAL | Status: DC | PRN

## 2014-05-02 MED ORDER — ONDANSETRON HCL 4 MG PO TABS: 4.0000 mg | ORAL_TABLET | Freq: Four times a day (QID) | ORAL | Status: DC | PRN

## 2014-05-02 MED ORDER — METOCLOPRAMIDE HCL 10 MG PO TABS: 5.0000 mg | ORAL_TABLET | Freq: Three times a day (TID) | ORAL | Status: DC | PRN

## 2014-05-02 MED ORDER — PROPOFOL 10 MG/ML IV BOLUS
INTRAVENOUS | Status: AC
  Filled 2014-05-02: qty 20

## 2014-05-02 MED ORDER — METHOCARBAMOL 1000 MG/10ML IJ SOLN
500.0000 mg | Freq: Four times a day (QID) | INTRAVENOUS | Status: DC | PRN
  Administered 2014-05-02: 500 mg via INTRAVENOUS
  Filled 2014-05-02 (×2): qty 5

## 2014-05-02 MED ORDER — DEXAMETHASONE SODIUM PHOSPHATE 10 MG/ML IJ SOLN
10.0000 mg | Freq: Once | INTRAMUSCULAR | Status: AC
  Administered 2014-05-03: 10 mg via INTRAVENOUS

## 2014-05-02 MED ORDER — MIDAZOLAM HCL 5 MG/5ML IJ SOLN
INTRAMUSCULAR | Status: DC | PRN
  Administered 2014-05-02: 2 mg via INTRAVENOUS

## 2014-05-02 MED ORDER — DIPHENHYDRAMINE HCL 25 MG PO CAPS: 25.0000 mg | ORAL_CAPSULE | Freq: Four times a day (QID) | ORAL | Status: DC | PRN

## 2014-05-02 MED ORDER — SIMVASTATIN 20 MG PO TABS
20.0000 mg | ORAL_TABLET | Freq: Every day | ORAL | Status: DC
  Administered 2014-05-02: 20 mg via ORAL
  Filled 2014-05-02 (×2): qty 1

## 2014-05-02 MED ORDER — OMEPRAZOLE MAGNESIUM 20 MG PO TBEC: 20.0000 mg | DELAYED_RELEASE_TABLET | Freq: Every day | ORAL | Status: DC

## 2014-05-02 MED ORDER — CELECOXIB 200 MG PO CAPS
200.0000 mg | ORAL_CAPSULE | Freq: Two times a day (BID) | ORAL | Status: DC
  Administered 2014-05-02 – 2014-05-03 (×3): 200 mg via ORAL
  Filled 2014-05-02 (×4): qty 1

## 2014-05-02 MED ORDER — CEFAZOLIN SODIUM-DEXTROSE 2-3 GM-% IV SOLR
2.0000 g | INTRAVENOUS | Status: AC
  Administered 2014-05-02: 2 g via INTRAVENOUS

## 2014-05-02 SURGICAL SUPPLY — 39 items
BAG ZIPLOCK 12X15 (MISCELLANEOUS) IMPLANT
CAPT HIP TOTAL 2 ×2 IMPLANT
COVER PERINEAL POST (MISCELLANEOUS) ×2 IMPLANT
DERMABOND ADVANCED (GAUZE/BANDAGES/DRESSINGS) ×1
DERMABOND ADVANCED .7 DNX12 (GAUZE/BANDAGES/DRESSINGS) ×1 IMPLANT
DRAPE C-ARM 42X120 X-RAY (DRAPES) ×2 IMPLANT
DRAPE STERI IOBAN 125X83 (DRAPES) ×2 IMPLANT
DRAPE U-SHAPE 47X51 STRL (DRAPES) ×6 IMPLANT
DRSG AQUACEL AG ADV 3.5X10 (GAUZE/BANDAGES/DRESSINGS) ×2 IMPLANT
DURAPREP 26ML APPLICATOR (WOUND CARE) ×2 IMPLANT
ELECT BLADE TIP CTD 4 INCH (ELECTRODE) ×2 IMPLANT
ELECT PENCIL ROCKER SW 15FT (MISCELLANEOUS) IMPLANT
ELECT REM PT RETURN 15FT ADLT (MISCELLANEOUS) IMPLANT
ELECT REM PT RETURN 9FT ADLT (ELECTROSURGICAL) ×2
ELECTRODE REM PT RTRN 9FT ADLT (ELECTROSURGICAL) ×1 IMPLANT
FACESHIELD WRAPAROUND (MASK) ×8 IMPLANT
GLOVE BIOGEL PI IND STRL 7.5 (GLOVE) ×1 IMPLANT
GLOVE BIOGEL PI IND STRL 8.5 (GLOVE) ×1 IMPLANT
GLOVE BIOGEL PI INDICATOR 7.5 (GLOVE) ×1
GLOVE BIOGEL PI INDICATOR 8.5 (GLOVE) ×1
GLOVE ECLIPSE 8.0 STRL XLNG CF (GLOVE) ×4 IMPLANT
GLOVE ORTHO TXT STRL SZ7.5 (GLOVE) ×2 IMPLANT
GOWN SPEC L3 XXLG W/TWL (GOWN DISPOSABLE) ×2 IMPLANT
GOWN STRL REUS W/TWL LRG LVL3 (GOWN DISPOSABLE) ×2 IMPLANT
HOLDER FOLEY CATH W/STRAP (MISCELLANEOUS) ×2 IMPLANT
KIT BASIN OR (CUSTOM PROCEDURE TRAY) ×2 IMPLANT
LIQUID BAND (GAUZE/BANDAGES/DRESSINGS) ×2 IMPLANT
PACK TOTAL JOINT (CUSTOM PROCEDURE TRAY) ×2 IMPLANT
SAW OSC TIP CART 19.5X105X1.3 (SAW) ×2 IMPLANT
SUT MNCRL AB 4-0 PS2 18 (SUTURE) ×2 IMPLANT
SUT VIC AB 1 CT1 36 (SUTURE) ×6 IMPLANT
SUT VIC AB 2-0 CT1 27 (SUTURE) ×2
SUT VIC AB 2-0 CT1 TAPERPNT 27 (SUTURE) ×2 IMPLANT
SUT VLOC 180 0 24IN GS25 (SUTURE) ×2 IMPLANT
SYR BULB IRRIGATION 50ML (SYRINGE) ×2 IMPLANT
TOWEL OR 17X26 10 PK STRL BLUE (TOWEL DISPOSABLE) ×2 IMPLANT
TOWEL OR NON WOVEN STRL DISP B (DISPOSABLE) IMPLANT
TRAY FOLEY CATH 16FRSI W/METER (SET/KITS/TRAYS/PACK) ×2 IMPLANT
WATER STERILE IRR 1500ML POUR (IV SOLUTION) ×2 IMPLANT

## 2014-05-02 NOTE — Anesthesia Postprocedure Evaluation (Signed)
  Anesthesia Post-op Note  Patient: Mike Mccann  Procedure(s) Performed: Procedure(s) (LRB): TOTAL RIGHT HIP ARTHROPLASTY ANTERIOR APPROACH (Right)  Patient Location: PACU  Anesthesia Type: Spinal  Level of Consciousness: awake and alert   Airway and Oxygen Therapy: Patient Spontanous Breathing  Post-op Pain: mild  Post-op Assessment: Post-op Vital signs reviewed, Patient's Cardiovascular Status Stable, Respiratory Function Stable, Patent Airway and No signs of Nausea or vomiting  Last Vitals:  Filed Vitals:   05/02/14 1320  BP: 103/64  Pulse: 59  Temp: 36.8 C  Resp: 16    Post-op Vital Signs: stable   Complications: No apparent anesthesia complications

## 2014-05-02 NOTE — Transfer of Care (Signed)
Immediate Anesthesia Transfer of Care Note  Patient: Mike BrownsJames P Wroe  Procedure(s) Performed: Procedure(s): TOTAL RIGHT HIP ARTHROPLASTY ANTERIOR APPROACH (Right)  Patient Location: PACU  Anesthesia Type:Spinal  Level of Consciousness: awake, alert  and oriented  Airway & Oxygen Therapy: Patient Spontanous Breathing and Patient connected to face mask oxygen  Post-op Assessment: Report given to PACU RN and Post -op Vital signs reviewed and stable  Post vital signs: Reviewed and stable  Complications: No apparent anesthesia complications

## 2014-05-02 NOTE — Interval H&P Note (Signed)
History and Physical Interval Note:  05/02/2014 6:51 AM  Mike Mccann  has presented today for surgery, with the diagnosis of OA RIGHT HIP  The various methods of treatment have been discussed with the patient and family. After consideration of risks, benefits and other options for treatment, the patient has consented to  Procedure(s): TOTAL RIGHT HIP ARTHROPLASTY ANTERIOR APPROACH (Right) as a surgical intervention .  The patient's history has been reviewed, patient examined, no change in status, stable for surgery.  I have reviewed the patient's chart and labs.  Questions were answered to the patient's satisfaction.     Shelda PalLIN,Marshea Wisher D

## 2014-05-02 NOTE — Op Note (Signed)
NAME:  Mike Mccann                ACCOUNT NO.: [id number]      MEDICAL RECORD NO.: [id number]      FACILITY[facility]  [facility]      PHYSICIAN:  Durene RomansLIN,Draco Malczewski D  DATE OF BIRTH:  09/21/1960     DATE OF PROCEDURE:  05/02/2014                                 OPERATIVE REPORT         PREOPERATIVE DIAGNOSIS: Right  hip osteoarthritis.      POSTOPERATIVE DIAGNOSIS:  Right hip osteoarthritis. History of left total hip replacement     PROCEDURE:  Right total hip replacement through an anterior approach   utilizing DePuy THR system, component size 56mm pinnacle cup, a size 36+4 neutral   Altrex liner, a size 6 Hi Tri Lock stem with a 36+1.5 delta ceramic   ball.      SURGEON:  Madlyn FrankelMatthew D. Charlann Boxerlin, M.D.      ASSISTANT:  Lanney GinsMatthew Babish, PA-C      ANESTHESIA:  Spinal.      SPECIMENS:  None.      COMPLICATIONS:  None.      BLOOD LOSS:  300 cc     DRAINS:  None.      INDICATION OF THE PROCEDURE:  Mike Mccann is a 53 y.o. male who had   presented to office for evaluation of right hip pain.  Radiographs revealed   progressive degenerative changes with bone-on-bone   articulation to the  hip joint.  The patient had painful limited range of   motion significantly affecting their overall quality of life.  The patient was failing to    respond to conservative measures, and at this point was ready   to proceed with more definitive measures.  The patient has noted progressive   degenerative changes in his hip, progressive problems and dysfunction   with regarding the hip prior to surgery.  Consent was obtained for   benefit of pain relief.  Specific risk of infection, DVT, component   failure, dislocation, need for revision surgery, as well discussion of   the anterior versus posterior approach were reviewed.  Consent was   obtained for benefit of anterior pain relief through an anterior   approach.      PROCEDURE IN DETAIL:  The patient was brought to operative theater.   Once adequate anesthesia, preoperative antibiotics, 2gm of Ancef administered.   The patient was positioned supine on the OSI Hanna table.  Once adequate   padding of boney process was carried out, we had predraped out the hip, and  used fluoroscopy to confirm orientation of the pelvis and position.      The right hip was then prepped and draped from proximal iliac crest to   mid thigh with shower curtain technique.      Time-out was performed identifying the patient, planned procedure, and   extremity.     An incision was then made 2 cm distal and lateral to the   anterior superior iliac spine extending over the orientation of the   tensor fascia lata muscle and sharp dissection was carried down to the   fascia of the muscle and protractor placed in the soft tissues.      The fascia was then incised.  The muscle  belly was identified and swept   laterally and retractor placed along the superior neck.  Following   cauterization of the circumflex vessels and removing some pericapsular   fat, a second cobra retractor was placed on the inferior neck.  A third   retractor was placed on the anterior acetabulum after elevating the   anterior rectus.  A L-capsulotomy was along the line of the   superior neck to the trochanteric fossa, then extended proximally and   distally.  Tag sutures were placed and the retractors were then placed   intracapsular.  We then identified the trochanteric fossa and   orientation of my neck cut, confirmed this radiographically   and then made a neck osteotomy with the femur on traction.  The femoral   head was removed without difficulty or complication.  Traction was let   off and retractors were placed posterior and anterior around the   acetabulum.      The labrum and foveal tissue were debrided.  I began reaming with a 49mm   reamer and reamed up to 55mm reamer with good bony bed preparation and a 56mm   cup was chosen.  The final 56mm Pinnacle cup was then  impacted under fluoroscopy  to confirm the depth of penetration and orientation with respect to   abduction.  A screw was placed followed by the hole eliminator.  The final   36+4 neutral Altrex liner was impacted with good visualized rim fit.  The cup was positioned anatomically within the acetabular portion of the pelvis.      At this point, the femur was rolled at 80 degrees.  Further capsule was   released off the inferior aspect of the femoral neck.  I then   released the superior capsule proximally.  The hook was placed laterally   along the femur and elevated manually and held in position with the bed   hook.  The leg was then extended and adducted with the leg rolled to 100   degrees of external rotation.  Once the proximal femur was fully   exposed, I used a box osteotome to set orientation.  I then began   broaching with the starting chili pepper broach and passed this by hand and then broached up to 6.  With the 6 broach in place I chose a high offset neck to match the other hip.  I did trial reduction and felt the +1.5 head ball best matched the offset and leg length to other hip, confirmed radiographically.   Given these findings, I went ahead and dislocated the hip, repositioned all   retractors and positioned the right hip in the extended and abducted position.  The final 6 Hi  Tri Lock stem was   chosen and it was impacted down to the level of neck cut.  Based on this   and the trial reduction, a 36+1.5 delta ceramic ball was chosen and   impacted onto a clean and dry trunnion, and the hip was reduced.  The   hip had been irrigated throughout the case again at this point.  I did   reapproximate the superior capsular leaflet to the anterior leaflet   using #1 Vicryl.  The fascia of the   tensor fascia lata muscle was then reapproximated using #1 Vicryl and #0 V-lock sutures.  The   remaining wound was closed with 2-0 Vicryl and running 4-0 Monocryl.   The hip was cleaned, dried,  and dressed sterilely using  Dermabond and   Aquacel dressing.  She was then brought   to recovery room in stable condition tolerating the procedure well.    Lanney Gins, PA-C was present for the entirety of the case involved from   preoperative positioning, perioperative retractor management, general   facilitation of the case, as well as primary wound closure as assistant.            Madlyn Frankel Charlann Boxer, M.D.        05/02/2014 8:46 AM

## 2014-05-02 NOTE — Anesthesia Preprocedure Evaluation (Signed)
Anesthesia Evaluation  Patient identified by MRN, date of birth, ID band Patient awake    Reviewed: Allergy & Precautions, H&P , NPO status , Patient's Chart, lab work & pertinent test results  History of Anesthesia Complications Negative for: history of anesthetic complications  Airway Mallampati: II  TM Distance: >3 FB Neck ROM: Full    Dental no notable dental hx. (+) Dental Advisory Given   Pulmonary neg pulmonary ROS,  breath sounds clear to auscultation  Pulmonary exam normal       Cardiovascular negative cardio ROS  + dysrhythmias (tachydysrhythmia, never caught on monitor, discussed by cardiology and currently no therapy) Rhythm:Regular Rate:Normal  2-D Echo, Mod RA& mild LA dilation, RVSP 33 mmHg. Nl LV function - EF > 55%. No valvular dz;; b) Myoview: ~ mild basal-mid inferoseptal ischemia: A low risk scan with excellent exercise capacity of 15 METS, exercising for 13 min, but horizontal ST depression in Lat leads;; c) repeat Myoview 02/2013: LOW RISK study w/ diaphragmatic attenuation       Neuro/Psych negative neurological ROS  negative psych ROS   GI/Hepatic Neg liver ROS, GERD-  Medicated and Controlled,  Endo/Other  negative endocrine ROS  Renal/GU negative Renal ROS  negative genitourinary   Musculoskeletal  (+) Arthritis -, Osteoarthritis,    Abdominal   Peds negative pediatric ROS (+)  Hematology negative hematology ROS (+)   Anesthesia Other Findings   Reproductive/Obstetrics negative OB ROS                            Anesthesia Physical Anesthesia Plan  ASA: II  Anesthesia Plan: Spinal   Post-op Pain Management:    Induction: Intravenous  Airway Management Planned: Simple Face Mask  Additional Equipment:   Intra-op Plan:   Post-operative Plan: Extubation in OR  Informed Consent: I have reviewed the patients History and Physical, chart, labs and  discussed the procedure including the risks, benefits and alternatives for the proposed anesthesia with the patient or authorized representative who has indicated his/her understanding and acceptance.   Dental advisory given  Plan Discussed with: CRNA  Anesthesia Plan Comments:       Anesthesia Quick Evaluation  

## 2014-05-02 NOTE — Progress Notes (Signed)
Patient having muscle spasm like symptoms in right quad. Patient concerned about the level of pain this is causing him to have.  Pain medication given to patient to help relieve symptoms. Muscle relaxer not available until 1550.   PA informed of patients symptoms and complaints. PA instructed RN to continue monitoring and use the muscle relaxer when available.

## 2014-05-02 NOTE — Progress Notes (Signed)
MD called about patients vital signs at 1650-1720, all vital signs communicated to MD. MD ordered a bolus of fluid, and EKG.   BP began to resolve after putting patient in trendelenburg position. Patient heart rate in the mid 50's upon initial arrival to the floor, heart rate stayed close to patient baseline.   MD rounded on patient after surgical cases were complete.  MD pleased with improved patient condition.   Patient continued complaining of sharp pain in an isolated area on surgical leg. MD started patient on Lyrica, for "nerve-like" pain related symptoms.   MD will reassess in the morning.

## 2014-05-02 NOTE — Anesthesia Procedure Notes (Signed)
Spinal Patient location during procedure: OR Start time: 05/02/2014 7:21 AM Staffing Anesthesiologist: Phillips GroutARIGNAN, PETER Resident/CRNA: [place], Melburn Treiber C Performed by: anesthesiologist  Preanesthetic Checklist Completed: patient identified, site marked, surgical consent, pre-op evaluation, timeout performed, IV checked, risks and benefits discussed and monitors and equipment checked Spinal Block Patient position: sitting Prep: Betadine and site prepped and draped Patient monitoring: cardiac monitor, heart rate, blood pressure and continuous pulse ox Approach: right paramedian Location: L3-4 Injection technique: single-shot Needle Needle gauge: 22 G Needle length: 10 cm Assessment Sensory level: T6 Additional Notes Attempt x2 with Sprotte by S. [place] and x 2 with Sprotte by Dr. Acey Lavarignan.  Attempt x 1 with 22g needle.  Patient tolerated well.  Negative heme, positive CSF.

## 2014-05-03 LAB — BASIC METABOLIC PANEL
Anion gap: 3 — ABNORMAL LOW (ref 5–15)
BUN: 15 mg/dL (ref 6–23)
CO2: 27 mmol/L (ref 19–32)
Calcium: 8.2 mg/dL — ABNORMAL LOW (ref 8.4–10.5)
Chloride: 105 mEq/L (ref 96–112)
Creatinine, Ser: 0.75 mg/dL (ref 0.50–1.35)
GFR calc Af Amer: >90 mL/min (ref >90)
GFR calc non Af Amer: >90 mL/min (ref >90)
GLUCOSE: 109 mg/dL — AB (ref 70–99)
POTASSIUM: 4.2 mmol/L (ref 3.5–5.1)
SODIUM: 135 mmol/L (ref 135–145)

## 2014-05-03 LAB — CBC
HEMATOCRIT: 32.2 % — AB (ref 39.0–52.0)
HEMOGLOBIN: 10.8 g/dL — AB (ref 13.0–17.0)
MCH: 31.4 pg (ref 26.0–34.0)
MCHC: 33.5 g/dL (ref 30.0–36.0)
MCV: 93.6 fL (ref 78.0–100.0)
Platelets: 184 10*3/uL (ref 150–400)
RBC: 3.44 MIL/uL — AB (ref 4.22–5.81)
RDW: 12.3 % (ref 11.5–15.5)
WBC: 9.6 10*3/uL (ref 4.0–10.5)

## 2014-05-03 MED ORDER — FERROUS SULFATE 325 (65 FE) MG PO TABS: 325.0000 mg | ORAL_TABLET | Freq: Three times a day (TID) | ORAL | Status: DC

## 2014-05-03 MED ORDER — PREGABALIN 75 MG PO CAPS: 75.0000 mg | ORAL_CAPSULE | Freq: Two times a day (BID) | ORAL | Status: DC

## 2014-05-03 MED ORDER — TRAMADOL HCL 50 MG PO TABS: 50.0000 mg | ORAL_TABLET | Freq: Four times a day (QID) | ORAL | Status: DC | PRN

## 2014-05-03 MED ORDER — ASPIRIN 325 MG PO TBEC: 325.0000 mg | DELAYED_RELEASE_TABLET | Freq: Two times a day (BID) | ORAL | Status: AC

## 2014-05-03 MED ORDER — POLYETHYLENE GLYCOL 3350 17 G PO PACK: 17.0000 g | PACK | Freq: Two times a day (BID) | ORAL | Status: DC

## 2014-05-03 MED ORDER — METHOCARBAMOL 500 MG PO TABS: 500.0000 mg | ORAL_TABLET | Freq: Four times a day (QID) | ORAL | Status: DC | PRN

## 2014-05-03 MED ORDER — DSS 100 MG PO CAPS: 100.0000 mg | ORAL_CAPSULE | Freq: Two times a day (BID) | ORAL | Status: DC

## 2014-05-03 NOTE — Progress Notes (Signed)
OT Cancellation Note  Patient Details Name: Mike Mccann MRN: [id number] DOB: 11/14/1960   Cancelled Treatment:    Reason Eval/Treat Not Completed: Other (comment). Pt had other hip done earlier this month and has no questions/concerns for OT.  He verbalizes understanding of bathroom transfers.  Mike Mccann 05/03/2014, 10:43 AM  Mike Mccann, OTR/L [phone] 05/03/2014

## 2014-05-03 NOTE — Progress Notes (Signed)
     Subjective: 1 Day Post-Op Procedure(s) (LRB): TOTAL RIGHT HIP ARTHROPLASTY ANTERIOR APPROACH (Right)   Patient reports pain as moderate, still having some nerve pain in the distal incision area. This area was massaged and started to feel better. No other events throughout the night. Ready to be discharged home if he does well with PT and pain controlled.   Objective:   VITALS:   Filed Vitals:   05/03/14 0443  BP: 101/59  Pulse: 60  Temp: 97.8 F (36.6 C)  Resp: 16    Dorsiflexion/Plantar flexion intact Incision: dressing C/D/I No cellulitis present Compartment soft  LABS  Recent Labs  05/03/14 0436  HGB 10.8*  HCT 32.2*  WBC 9.6  PLT 184     Recent Labs  05/03/14 0436  NA 135  K 4.2  BUN 15  CREATININE 0.75  GLUCOSE 109*     Assessment/Plan: 1 Day Post-Op Procedure(s) (LRB): TOTAL RIGHT HIP ARTHROPLASTY ANTERIOR APPROACH (Right) Foley cath d/c'ed Advance diet Up with therapy D/C IV fluids Discharge home with home health  [facility]. Follow up with OLIN,Sabina Beavers D in 2 weeks.  Contact information:  [facility] [address] [place] [id number] [phone]        Anastasio AuerbachMatthew S. Obdulia Steier   PAC  05/03/2014, 9:14 AM

## 2014-05-03 NOTE — Care Management Note (Addendum)
    Page 1 of 1   05/04/2014     8:51:23 AM CARE MANAGEMENT NOTE 05/04/2014  Patient:  TRAQUAN, DUARTE   Account Number:  [id number]  Date Initiated:  05/03/2014  Documentation initiated by:  [facility]  Subjective/Objective Assessment:   adm: TOTAL RIGHT HIP ARTHROPLASTY ANTERIOR APPROACH (Right)     Action/Plan:   discharge planning   Anticipated DC Date:  05/03/2014   Anticipated DC Plan:  [place]         Choice offered to / List presented to:  C-1 Patient   DME arranged  NA      DME agency  NA     [place] arranged  HH-2 PT      [place]   Status of service:  Completed, signed off Medicare Important Message given?   (If response is "NO", the following Medicare IM given date fields will be blank) Date Medicare IM given:   Medicare IM given by:   Date Additional Medicare IM given:   Additional Medicare IM given by:    Discharge Disposition:  [place]  Per UR Regulation:  Reviewed for med. necessity/level of care/duration of stay  If discussed at [place] of Stay Meetings, dates discussed:    Comments:  05/03/14 08:30 Cm met with pt in room to offer choice of home health agency.  Pt chooses [facility] to render HHPT. Address and contact information verified with pt.  No DME needed.  Referral given to [facility] rep, tim (on unit).  No other CM needs were communicated.  Mariane Masters, BSN, Cm [phone].

## 2014-05-03 NOTE — Evaluation (Signed)
Physical Therapy Evaluation Patient Details Name: Mike BrownsJames P Clymer MRN: [id number] DOB: 04/22/1961 Today's Date: 05/03/2014   History of Present Illness  R THR: L THR 04/04/14  Clinical Impression  Pt s/p R THR presents with decreased R LE strength/ROM and post op pain limiting functional mobility.  Pt should progress well to d/c home with family assist and HHPT follow up.    Follow Up Recommendations Home health PT    Equipment Recommendations  None recommended by PT    Recommendations for Other Services OT consult     Precautions / Restrictions Precautions Precautions: Fall Restrictions Weight Bearing Restrictions: No Other Position/Activity Restrictions: WBAT      Mobility  Bed Mobility Overal bed mobility: Needs Assistance Bed Mobility: Supine to Sit     Supine to sit: Min assist     General bed mobility comments: cues for sequence and min assist to manage R LE  Transfers Overall transfer level: Needs assistance Equipment used: Rolling walker (2 wheeled) Transfers: Sit to/from Stand Sit to Stand: Min assist         General transfer comment: cues for LE management and use of UEs to self assist  Ambulation/Gait Ambulation/Gait assistance: Min assist Ambulation Distance (Feet): 75 Feet Assistive device: Rolling walker (2 wheeled) Gait Pattern/deviations: Step-to pattern;Decreased step length - right;Decreased step length - left;Shuffle;Trunk flexed Gait velocity: decr   General Gait Details: cues for posture, position from RW, ER on R, and initial sequence  Stairs            Wheelchair Mobility    Modified Rankin (Stroke Patients Only)       Balance                                             Pertinent Vitals/Pain Pain Assessment: 0-10 Pain Score: 3  Pain Location: R hip/thigh Pain Descriptors / Indicators: Aching;Burning Pain Intervention(s): Limited activity within patient's tolerance;Monitored during  session;Premedicated before session;Ice applied    Home Living Family/patient expects to be discharged to:: Private residence Living Arrangements: Spouse/significant other Available Help at Discharge: Family Type of Home: House Home Access: Stairs to enter Entrance Stairs-Rails: None Entrance Stairs-Number of Steps: 2 Home Layout: Able to live on main level with bedroom/bathroom Home Equipment: Walker - 2 wheels      Prior Function Level of Independence: Independent               Hand Dominance        Extremity/Trunk Assessment   Upper Extremity Assessment: Overall WFL for tasks assessed           Lower Extremity Assessment: RLE deficits/detail RLE Deficits / Details: Hip strength 2/5 with AAROM at hip to 70 flex and 15 abd.  Pt very apprehensive with movement 2* pain last night    Cervical / Trunk Assessment: Normal  Communication   Communication: No difficulties  Cognition Arousal/Alertness: Awake/alert Behavior During Therapy: WFL for tasks assessed/performed Overall Cognitive Status: Within Functional Limits for tasks assessed                      General Comments      Exercises Total Joint Exercises Ankle Circles/Pumps: AROM;Both;15 reps;Supine Quad Sets: AROM;Both;10 reps;Supine Heel Slides: AAROM;Right;20 reps;Supine Hip ABduction/ADduction: AAROM;Right;15 reps;Supine      Assessment/Plan    PT Assessment Patient needs continued PT services  PT  Diagnosis Difficulty walking   PT Problem List Decreased strength;Decreased range of motion;Decreased activity tolerance;Decreased mobility;Decreased knowledge of use of DME;Pain  PT Treatment Interventions DME instruction;Gait training;Stair training;Functional mobility training;Therapeutic activities;Therapeutic exercise;Patient/family education   PT Goals (Current goals can be found in the Care Plan section) Acute Rehab PT Goals Patient Stated Goal: Back to work by Jan 11 PT Goal  Formulation: With patient Time For Goal Achievement: 05/10/14 Potential to Achieve Goals: Good    Frequency 7X/week   Barriers to discharge        Co-evaluation               End of Session Equipment Utilized During Treatment: Gait belt Activity Tolerance: Patient tolerated treatment well Patient left: in chair;with call bell/phone within reach;with family/visitor present Nurse Communication: Mobility status         Time: 9147-82950905-0933 PT Time Calculation (min) (ACUTE ONLY): 28 min   Charges:   PT Evaluation $Initial PT Evaluation Tier I: 1 Procedure PT Treatments $Gait Training: 8-22 mins $Therapeutic Exercise: 8-22 mins   PT G Codes:        Dagan Heinz 05/03/2014, 11:53 AM

## 2014-05-03 NOTE — Progress Notes (Signed)
Physical Therapy Treatment Patient Details Name: Mike Mccann MRN: [id number] DOB: 02/13/1961 Today's Date: 05/03/2014    History of Present Illness R THR: L THR 04/04/14    PT Comments    Pt progressing well and eager for d/c.  Reviewed stairs and car transfers  Follow Up Recommendations  Home health PT     Equipment Recommendations  None recommended by PT    Recommendations for Other Services       Precautions / Restrictions Precautions Precautions: Fall Restrictions Weight Bearing Restrictions: No Other Position/Activity Restrictions: WBAT    Mobility  Bed Mobility Overal bed mobility: Needs Assistance Bed Mobility: Supine to Sit     Supine to sit: Supervision     General bed mobility comments: cues for sequence and min assist to manage R LE  Transfers Overall transfer level: Needs assistance Equipment used: Rolling walker (2 wheeled) Transfers: Sit to/from Stand Sit to Stand: Supervision         General transfer comment: cues for LE management and use of UEs to self assist  Ambulation/Gait Ambulation/Gait assistance: Supervision Ambulation Distance (Feet): 450 Feet Assistive device: Rolling walker (2 wheeled) Gait Pattern/deviations: Step-to pattern;Step-through pattern;Shuffle     General Gait Details: cues for posture, position from RW, ER on R, and initial sequence   Stairs Stairs: Yes Stairs assistance: Min assist Stair Management: No rails;Step to pattern;One rail Right;Forwards;With walker;With cane Number of Stairs: 4 General stair comments: up 4 steps with RW bkwd, down 4 steps with rail and cane.  Cues for sequence and foot/AD placement  Wheelchair Mobility    Modified Rankin (Stroke Patients Only)       Balance                                    Cognition Arousal/Alertness: Awake/alert Behavior During Therapy: WFL for tasks assessed/performed Overall Cognitive Status: Within Functional Limits for tasks  assessed                      Exercises      General Comments        Pertinent Vitals/Pain Pain Assessment: 0-10 Pain Score: 3  Pain Location: R hip/thigh Pain Descriptors / Indicators: Aching;Burning Pain Intervention(s): Limited activity within patient's tolerance;Monitored during session;Premedicated before session;Ice applied    Home Living                      Prior Function            PT Goals (current goals can now be found in the care plan section) Acute Rehab PT Goals Patient Stated Goal: Back to work by Jan 11 PT Goal Formulation: With patient Time For Goal Achievement: 05/10/14 Potential to Achieve Goals: Good Progress towards PT goals: Progressing toward goals    Frequency  7X/week    PT Plan Current plan remains appropriate    Co-evaluation             End of Session Equipment Utilized During Treatment: Gait belt Activity Tolerance: Patient tolerated treatment well Patient left: in chair;with call bell/phone within reach;with family/visitor present     Time: 4098-11911318-1341 PT Time Calculation (min) (ACUTE ONLY): 23 min  Charges:  $Gait Training: 8-22 mins $Therapeutic Activity: 8-22 mins                    G Codes:  Arlinda Barcelona 05/03/2014, 3:18 PM

## 2014-05-04 ENCOUNTER — Encounter (HOSPITAL_COMMUNITY): Payer: Self-pay | Admitting: Orthopedic Surgery

## 2014-05-05 ENCOUNTER — Observation Stay (HOSPITAL_COMMUNITY)
Admission: EM | Admit: 2014-05-05 | Discharge: 2014-05-06 | Disposition: A | Discharge status: Home / Self Care | Payer: BC Managed Care – PPO | Attending: Orthopedic Surgery | Admitting: Orthopedic Surgery

## 2014-05-05 ENCOUNTER — Observation Stay (HOSPITAL_COMMUNITY): Payer: BC Managed Care – PPO | Admitting: Anesthesiology

## 2014-05-05 ENCOUNTER — Encounter: Payer: Self-pay | Admitting: Orthopedic Surgery

## 2014-05-05 ENCOUNTER — Encounter (HOSPITAL_COMMUNITY): Payer: Self-pay | Admitting: General Practice

## 2014-05-05 DIAGNOSIS — G971 Other reaction to spinal and lumbar puncture: Secondary | ICD-10-CM | POA: Diagnosis present

## 2014-05-05 DIAGNOSIS — G4440 Drug-induced headache, not elsewhere classified, not intractable: Secondary | ICD-10-CM | POA: Diagnosis not present

## 2014-05-05 DIAGNOSIS — Z96649 Presence of unspecified artificial hip joint: Secondary | ICD-10-CM

## 2014-05-05 DIAGNOSIS — Y838 Other surgical procedures as the cause of abnormal reaction of the patient, or of later complication, without mention of misadventure at the time of the procedure: Secondary | ICD-10-CM | POA: Insufficient documentation

## 2014-05-05 DIAGNOSIS — T8859XA Other complications of anesthesia, initial encounter: Principal | ICD-10-CM | POA: Insufficient documentation

## 2014-05-05 DIAGNOSIS — Y92234 Operating room of hospital as the place of occurrence of the external cause: Secondary | ICD-10-CM | POA: Diagnosis not present

## 2014-05-05 MED ORDER — ONDANSETRON HCL 4 MG/2ML IJ SOLN: 4.0000 mg | Freq: Four times a day (QID) | INTRAMUSCULAR | Status: DC | PRN

## 2014-05-05 MED ORDER — HYDROCODONE-ACETAMINOPHEN 5-325 MG PO TABS
1.0000 | ORAL_TABLET | ORAL | Status: DC | PRN
  Filled 2014-05-05: qty 2

## 2014-05-05 MED ORDER — DIAZEPAM 5 MG PO TABS: 5.0000 mg | ORAL_TABLET | Freq: Four times a day (QID) | ORAL | Status: DC | PRN

## 2014-05-05 MED ORDER — LACTATED RINGERS IV SOLN
INTRAVENOUS | Status: DC
  Administered 2014-05-05: 150 mL/h via INTRAVENOUS
  Administered 2014-05-06: 06:00:00 via INTRAVENOUS

## 2014-05-05 MED ORDER — LACTATED RINGERS IV SOLN: INTRAVENOUS | Status: DC

## 2014-05-05 MED ORDER — HYDROMORPHONE HCL 1 MG/ML IJ SOLN
0.5000 mg | INTRAMUSCULAR | Status: DC | PRN
  Administered 2014-05-06: 0.5 mg via INTRAVENOUS
  Filled 2014-05-05: qty 1

## 2014-05-05 MED ORDER — ASPIRIN EC 325 MG PO TBEC
325.0000 mg | DELAYED_RELEASE_TABLET | Freq: Every day | ORAL | Status: DC
  Administered 2014-05-06: 325 mg via ORAL
  Filled 2014-05-05 (×2): qty 1

## 2014-05-05 NOTE — Anesthesia Procedure Notes (Signed)
Epidural Patient location during procedure: holding area  Staffing Anesthesiologist: Gonsalo Cuthbertson Performed by: anesthesiologist   Preanesthetic Checklist Completed: patient identified, site marked, surgical consent, pre-op evaluation, timeout performed, IV checked, risks and benefits discussed, monitors and equipment checked and post-op pain management  Epidural Patient position: sitting Prep: Betadine Patient monitoring: heart rate, continuous pulse ox and blood pressure  Needle:  Needle type: Tuohy  Needle gauge: 18 G Needle length: 9 cm and 9 Needle insertion depth: 6 cm Catheter type: closed end flexible  Additional Notes Epidural blood patch placed via tuohy needle, insertion as above. Patient required multiple attempts for venous access and ended up with a brachial arterial draw for blood sample.  Post injection, headache much better but not totally alleviated. Will be admitted by Dr. Rennis Chris for hydrationReason for block:post-op pain management

## 2014-05-05 NOTE — Progress Notes (Signed)
Pt voided 900 cc in urinal

## 2014-05-05 NOTE — Progress Notes (Signed)
Pt in holding area here with a spinal headache. Very  Dehydrated. Unable to access IV's after multiple sticks and  Able to draw only  10cc's from venous access,Dr. Okey Dupre unable to access vein with ultrasound and   Used untrasound for arterioal  Blood. Dr. Okey Dupre placed arterial catheter via ultrasound and [place] CRNA withdrew 12cc's of arterial bloo to be injuected epidurally. Pt tolerated procedure well  Rica Mast

## 2014-05-05 NOTE — Progress Notes (Signed)
Pt transported to 1530 via stretcher

## 2014-05-05 NOTE — Progress Notes (Signed)
Transported to 1530, wife at bedside, temp 98.1 oral, HR 55, SAO2 100 %, BP 133/70, resps 16

## 2014-05-05 NOTE — Progress Notes (Signed)
BP 119/60, HR 59, Resp 15, SAO2 100 %,  Temp 98.9, wife at bedside

## 2014-05-05 NOTE — H&P (Signed)
Mike Mccann    Chief Complaint: post op headache, nausea, vomiting HPI: The patient is a 54 y.o. male s/p right THA by Dr. Charlann Boxer last Tuesday, persistant and progressively more severe headaches with up right positioning since surgery.  Past Medical History  Diagnosis Date  . Tachycardia, paroxysmal     No true diagnosis by event monitor  . Dyslipidemia, goal LDL below 130     On statin, "ratio is really off"  . Abnormal finding on EKG October 2013     a) 2-D Echo, Mod RA& mild LA dilation, RVSP 33 mmHg. Nl LV function - EF > 55%. No valvular dz;; b) Myoview: ~ mild basal-mid inferoseptal ischemia: A low risk scan with excellent exercise capacity of 15 METS, exercising for 13 min, but horizontal ST depression in Lat leads;; c) repeat Myoview 02/2013: LOW RISK study w/ diaphragmatic attenuation   . GERD (gastroesophageal reflux disease)   . Dysrhythmia     tachycardia  . Arthritis     hip  . Complication of anesthesia     given more medications than usual-"unsure if that happend"    Past Surgical History  Procedure Laterality Date  . Wisdom tooth extraction  10 years ago  . Colonoscopy  2 years ago  . Vasectomy  8 years ago  . Total hip arthroplasty Left 04/04/2014    Procedure: LEFT TOTAL HIP ARTHROPLASTY ANTERIOR APPROACH;  Surgeon: [name], MD;  Location: [facility];  Service: Orthopedics;  Laterality: Left;  . Total hip arthroplasty Right 05/02/2014    Procedure: TOTAL RIGHT HIP ARTHROPLASTY ANTERIOR APPROACH;  Surgeon: [name], MD;  Location: [facility];  Service: Orthopedics;  Laterality: Right;    No family history on file.  Social History:  reports that he has never smoked. He has never used smokeless tobacco. He reports that he drinks alcohol. He reports that he does not use illicit drugs.  Allergies: No Known Allergies   (Not in a hospital admission)   Physical Exam: slender WM in severe discomfort, head ache n/v, a and o, neck pain with ROM, non focal neuro  exam, n/v intacat distally BLE, hip incision dry, thigh soft.  Vitals  @  Assessment/Plan  Impression: post op spinal headache  Plan of Action: epidural blood patch by anesthesia, admit for hydration and monitoring of vitals  Kalese Ensz M 05/05/2014, 9:32 PM

## 2014-05-05 NOTE — Progress Notes (Signed)
PACU note; report to [facility], RN and [place], Charity fundraiser

## 2014-05-05 NOTE — Progress Notes (Signed)
PACU note----pt transported to PACU for observation; BP 116/61, HR 54, Resp 17, SAO2 100; Dr. Okey Dupre in to check pt; pt resting quietly, denies any headache or nausea

## 2014-05-06 LAB — CBC WITH DIFFERENTIAL/PLATELET
BASOS ABS: 0 10*3/uL (ref 0.0–0.1)
BASOS PCT: 0 % (ref 0–1)
Eosinophils Absolute: 0.1 10*3/uL (ref 0.0–0.7)
Eosinophils Relative: 1 % (ref 0–5)
HCT: 31.1 % — ABNORMAL LOW (ref 39.0–52.0)
HEMOGLOBIN: 10.6 g/dL — AB (ref 13.0–17.0)
LYMPHS ABS: 1.9 10*3/uL (ref 0.7–4.0)
Lymphocytes Relative: 33 % (ref 12–46)
MCH: 31.8 pg (ref 26.0–34.0)
MCHC: 34.1 g/dL (ref 30.0–36.0)
MCV: 93.4 fL (ref 78.0–100.0)
Monocytes Absolute: 0.7 10*3/uL (ref 0.1–1.0)
Monocytes Relative: 13 % — ABNORMAL HIGH (ref 3–12)
NEUTROS ABS: 3 10*3/uL (ref 1.7–7.7)
NEUTROS PCT: 53 % (ref 43–77)
PLATELETS: 173 10*3/uL (ref 150–400)
RBC: 3.33 MIL/uL — AB (ref 4.22–5.81)
RDW: 12.1 % (ref 11.5–15.5)
WBC: 5.8 10*3/uL (ref 4.0–10.5)

## 2014-05-06 LAB — COMPREHENSIVE METABOLIC PANEL
ALT: 16 U/L (ref 0–53)
ANION GAP: 6 (ref 5–15)
AST: 34 U/L (ref 0–37)
Albumin: 2.8 g/dL — ABNORMAL LOW (ref 3.5–5.2)
Alkaline Phosphatase: 52 U/L (ref 39–117)
BUN: 9 mg/dL (ref 6–23)
CO2: 28 mmol/L (ref 19–32)
Calcium: 8.6 mg/dL (ref 8.4–10.5)
Chloride: 105 mEq/L (ref 96–112)
Creatinine, Ser: 0.74 mg/dL (ref 0.50–1.35)
GFR calc Af Amer: >90 mL/min (ref >90)
GFR calc non Af Amer: >90 mL/min (ref >90)
Glucose, Bld: 100 mg/dL — ABNORMAL HIGH (ref 70–99)
POTASSIUM: 4 mmol/L (ref 3.5–5.1)
SODIUM: 139 mmol/L (ref 135–145)
Total Bilirubin: 1.4 mg/dL — ABNORMAL HIGH (ref 0.3–1.2)
Total Protein: 5.5 g/dL — ABNORMAL LOW (ref 6.0–8.3)

## 2014-05-06 NOTE — Discharge Summary (Signed)
Reviewed discharge instructions with pt and spouse including follow-up appointment, medications, and precautions.  Pt/spouse verbalized understanding of all instructions without questions.  Pt being d/c into care of spouse.

## 2014-05-06 NOTE — Discharge Summary (Signed)
Physician Discharge Summary  Patient ID: Mike Mccann MRN: [id number] DOB/AGE: [birth year] 55 y.o.  Admit date: 05/02/2014 Discharge date: 05/03/2014   Procedures:  Procedure(s) (LRB): TOTAL RIGHT HIP ARTHROPLASTY ANTERIOR APPROACH (Right)  Attending Physician:  Dr. Durene Romans   Admission Diagnoses:   Right hip primary OA / pain  Discharge Diagnoses:  Principal Problem:   S/P right THA, AA  Past Medical History  Diagnosis Date  . Tachycardia, paroxysmal     No true diagnosis by event monitor  . Dyslipidemia, goal LDL below 130     On statin, "ratio is really off"  . Abnormal finding on EKG October 2013     a) 2-D Echo, Mod RA& mild LA dilation, RVSP 33 mmHg. Nl LV function - EF > 55%. No valvular dz;; b) Myoview: ~ mild basal-mid inferoseptal ischemia: A low risk scan with excellent exercise capacity of 15 METS, exercising for 13 min, but horizontal ST depression in Lat leads;; c) repeat Myoview 02/2013: LOW RISK study w/ diaphragmatic attenuation   . GERD (gastroesophageal reflux disease)   . Dysrhythmia     tachycardia  . Arthritis     hip  . Complication of anesthesia     given more medications than usual-"unsure if that happend"    HPI: Mike Mccann, 54 y.o. male, has a history of pain and functional disability in the right hip(s) due to arthritis and patient has failed non-surgical conservative treatments for greater than 12 weeks to include NSAID's and/or analgesics and activity modification. Onset of symptoms was gradual starting >10 years ago with gradually worsening course since that time.The patient noted prior procedures of the hip to include arthroplasty on the left hip on 04/04/2014 per Dr. Charlann Boxer. Patient currently rates pain in the right hip at 7 out of 10 with activity. Patient has worsening of pain with activity and weight bearing, trendelenberg gait, pain that interfers with activities of daily living and pain with passive range of motion. Patient has  evidence of periarticular osteophytes and joint space narrowing by imaging studies. This condition presents safety issues increasing the risk of falls. There is no current active infection. Risks, benefits and expectations were discussed with the patient. Risks including but not limited to the risk of anesthesia, blood clots, nerve damage, blood vessel damage, failure of the prosthesis, infection and up to and including death. Patient understand the risks, benefits and expectations and wishes to proceed with surgery.  PCP: Ezequiel Kayser, MD   Discharged Condition: good  Hospital Course:  Patient underwent the above stated procedure on 05/02/2014. Patient tolerated the procedure well and brought to the recovery room in good condition and subsequently to the floor.  POD #1 BP: 101/59 ; Pulse: 60 ; Temp: 97.8 F (36.6 C) ; Resp: 16 Patient reports pain as moderate, still having some nerve pain in the distal incision area. This area was massaged and started to feel better. No other events throughout the night. Ready to be discharged home. Dorsiflexion/plantar flexion intact, incision: dressing C/D/I, no cellulitis present and compartment soft.    LABS  Basename    HGB  10.8  HCT  32.2    Discharge Exam: General appearance: alert, cooperative and no distress Extremities: Homans sign is negative, no sign of DVT, no edema, redness or tenderness in the calves or thighs and no ulcers, gangrene or trophic changes  Disposition: Home with follow up in 2 weeks   Follow-up Information    Follow up with [name],  MD. Schedule an appointment as soon as possible for a visit in 2 weeks.   Specialty:  Orthopedic Surgery   Contact information:   [address] [place] [address] [phone]       Follow up with [facility].   Why:  home health physical therapy   Contact information:   [address] [place] [address] [phone]        Discharge Instructions    Call MD / Call 911    Complete by:  As directed   If you experience chest pain or shortness of breath, CALL 911 and be transported to the hospital emergency room.  If you develope a fever above 101 F, pus (white drainage) or increased drainage or redness at the wound, or calf pain, call your surgeon's office.     Change dressing    Complete by:  As directed   Maintain surgical dressing until follow up in the clinic. If the edges start to pull up, may reinforce with tape. If the dressing is no longer working, may remove and cover with gauze and tape, but must keep the area dry and clean.  Call with any questions or concerns.     Constipation Prevention    Complete by:  As directed   Drink plenty of fluids.  Prune juice may be helpful.  You may use a stool softener, such as Colace (over the counter) 100 mg twice a day.  Use MiraLax (over the counter) for constipation as needed.     Diet - low sodium heart healthy    Complete by:  As directed      Discharge instructions    Complete by:  As directed   Maintain surgical dressing until follow up in the clinic. If the edges start to pull up, may reinforce with tape. If the dressing is no longer working, may remove and cover with gauze and tape, but must keep the area dry and clean.  Follow up in 2 weeks at [facility] Call with any questions or concerns.     Driving restrictions    Complete by:  As directed   No driving for 4 weeks     Increase activity slowly as tolerated    Complete by:  As directed      TED hose    Complete by:  As directed   Use stockings (TED hose) for 2 weeks on both leg(s).  You may remove them at night for sleeping.     Weight bearing as tolerated    Complete by:  As directed   Laterality:  right  Extremity:  Lower             Medication List    STOP taking these medications        HYDROcodone-acetaminophen 7.5-325 MG per tablet  Commonly known as:  NORCO      TAKE  these medications        aspirin 325 MG EC tablet  Take 1 tablet (325 mg total) by mouth 2 (two) times daily.     DSS 100 MG Caps  Take 100 mg by mouth 2 (two) times daily.     ferrous sulfate 325 (65 FE) MG tablet  Take 1 tablet (325 mg total) by mouth 3 (three) times daily after meals.     methocarbamol 500 MG tablet  Commonly known as:  ROBAXIN  Take 1 tablet (500 mg total) by mouth every 6 (six) hours as  needed for muscle spasms.     omeprazole 20 MG tablet  Commonly known as:  PRILOSEC OTC  Take 20 mg by mouth daily.     polyethylene glycol packet  Commonly known as:  MIRALAX / GLYCOLAX  Take 17 g by mouth 2 (two) times daily.     pregabalin 75 MG capsule  Commonly known as:  LYRICA  Take 1 capsule (75 mg total) by mouth 2 (two) times daily.     simvastatin 20 MG tablet  Commonly known as:  ZOCOR  Take 20 mg by mouth daily.     traMADol 50 MG tablet  Commonly known as:  ULTRAM  Take 1-2 tablets (50-100 mg total) by mouth every 6 (six) hours as needed for moderate pain.         Signed: Anastasio Auerbach. Marelly Wehrman   PA-C  05/06/2014, 8:48 AM

## 2014-05-06 NOTE — Progress Notes (Signed)
     Subjective: POD #1 Blood patch for spinal HA  Patient reports pain as mild, he almost instantaneously felt better after receiving the blood patch.  He has been laying flat since and continues to feel good.  Feels that he can get home, knowing he will continue to take it easy, laying flat and staying well hydrated.  The hip had not really given him any issues since surgery.  Objective:   VITALS:   Filed Vitals:   05/06/14 0648  BP: 110/62  Pulse: 54  Temp: 98.7 F (37.1 C)  Resp: 14    Dorsiflexion/Plantar flexion intact Incision: dressing C/D/I No cellulitis present Compartment soft  LABS  Recent Labs  05/06/14 0510  HGB 10.6*  HCT 31.1*  WBC 5.8  PLT 173     Recent Labs  05/06/14 0510  NA 139  K 4.0  BUN 9  CREATININE 0.74  GLUCOSE 100*     Assessment/Plan: POD #1 Blood patch for spinal HA  He is to take it easy Continue to rest and hydrate himself. Discharge home with home health, from the THA, AA surgery. Follow up in 2 weeks at [facility]. Follow up with OLIN,Mertis Mosher D in 2 weeks.  Contact information:  [facility] [address] [place] [address] [phone]        Anastasio Auerbach. Brienna Bass   PAC  05/06/2014, 8:20 AM

## 2014-05-06 NOTE — Progress Notes (Signed)
UR ordered.

## 2014-05-06 NOTE — Progress Notes (Signed)
Nutrition Brief Note  Patient identified on the Malnutrition Screening Tool (MST) Report  Pt with minimal weight loss, overall stable. Pt diet just advanced, and eating 100%.  Wt Readings from Last 15 Encounters:  05/05/14 163 lb (73.936 kg)  05/02/14 169 lb (76.658 kg)  04/25/14 169 lb (76.658 kg)  04/04/14 169 lb (76.658 kg)  03/27/14 169 lb 9.6 oz (76.93 kg)  02/02/14 172 lb (78.019 kg)  02/02/13 170 lb (77.111 kg)    Body mass index is 22.74 kg/(m^2). Patient meets criteria for normal range based on current BMI.   Current diet order is Low sodium Heart Healthy, patient is consuming approximately 100% of meals at this time. Labs and medications reviewed.   No nutrition interventions warranted at this time. If nutrition issues arise, please consult RD.   Tilda Franco, MS, RD, LDN Pager: [phone] After Hours Pager: [phone]

## 2014-05-09 NOTE — Discharge Summary (Signed)
Physician Discharge Summary  Patient ID: Mike Mccann MRN: [id number] DOB/AGE: Jul 14, 1960 54 y.o.  Admit date: 05/05/2014 Discharge date: 05/06/2014   Procedures:   Epidural blood patch  Attending Physician:  Dr. Durene Romans   Admission Diagnoses:   Post op headache, nausea, vomiting  Discharge Diagnoses:  Active Problems:   Spinal headache   Headache, spinal, postoperative  Past Medical History  Diagnosis Date  . Tachycardia, paroxysmal     No true diagnosis by event monitor  . Dyslipidemia, goal LDL below 130     On statin, "ratio is really off"  . Abnormal finding on EKG October 2013     a) 2-D Echo, Mod RA& mild LA dilation, RVSP 33 mmHg. Nl LV function - EF > 55%. No valvular dz;; b) Myoview: ~ mild basal-mid inferoseptal ischemia: A low risk scan with excellent exercise capacity of 15 METS, exercising for 13 min, but horizontal ST depression in Lat leads;; c) repeat Myoview 02/2013: LOW RISK study w/ diaphragmatic attenuation   . GERD (gastroesophageal reflux disease)   . Dysrhythmia     tachycardia  . Arthritis     hip  . Complication of anesthesia     given more medications than usual-"unsure if that happend"    HPI: The patient is a 54 y.o. male s/p right THA by Dr. Charlann Boxer last Tuesday, persistant and progressively more severe headaches with up right positioning since surgery.  PCP: Ezequiel Kayser, MD   Discharged Condition: good  Hospital Course:  Patient underwent the above stated procedure on 05/05/2014. Patient tolerated the procedure well and brought to the recovery room in good condition and subsequently to the floor.  POD #1 BP: 110/62 ; Pulse: 54 ; Temp: 98.7 F (37.1 C) ; Resp: 14 Patient reports pain as mild, he almost instantaneously felt better after receiving the blood patch. He has been laying flat since and continues to feel good. Feels that he can get home, knowing he will continue to take it easy, laying flat and staying well hydrated. The hip  had not really given him any issues since surgery. Dorsiflexion/plantar flexion intact, incision: dressing C/D/I, no cellulitis present and compartment soft.   LABS  Basename    HGB  10.6  HCT  31.1    Discharge Exam: General appearance: alert, cooperative and no distress Extremities: Homans sign is negative, no sign of DVT, no edema, redness or tenderness in the calves or thighs and no ulcers, gangrene or trophic changes  Disposition: Home with follow up in 2 weeks   Follow-up Information    Follow up with [name], MD. Schedule an appointment as soon as possible for a visit in 2 weeks.   Specialty:  Orthopedic Surgery   Contact information:   [address] [place] [address] [phone]       Discharge Instructions    Call MD / Call 911    Complete by:  As directed   If you experience chest pain or shortness of breath, CALL 911 and be transported to the hospital emergency room.  If you develope a fever above 101 F, pus (white drainage) or increased drainage or redness at the wound, or calf pain, call your surgeon's office.     Change dressing    Complete by:  As directed   Maintain surgical dressing until follow up in the clinic. If the edges start to pull up, may reinforce with tape. If the dressing is no longer working, may remove  and cover with gauze and tape, but must keep the area dry and clean.  Call with any questions or concerns.     Constipation Prevention    Complete by:  As directed   Drink plenty of fluids.  Prune juice may be helpful.  You may use a stool softener, such as Colace (over the counter) 100 mg twice a day.  Use MiraLax (over the counter) for constipation as needed.     Diet - low sodium heart healthy    Complete by:  As directed      Discharge instructions    Complete by:  As directed   Maintain surgical dressing until follow up in the clinic. If the edges start to pull up, may reinforce with tape. If the dressing is no  longer working, may remove and cover with gauze and tape, but must keep the area dry and clean.  [facility] Call with any questions or concerns.     Increase activity slowly as tolerated    Complete by:  As directed      TED hose    Complete by:  As directed   Use stockings (TED hose) for 2 weeks on both leg(s).  You may remove them at night for sleeping.     Weight bearing as tolerated    Complete by:  As directed              Medication List    TAKE these medications        aspirin 325 MG EC tablet  Take 1 tablet (325 mg total) by mouth 2 (two) times daily.     DSS 100 MG Caps  Take 100 mg by mouth 2 (two) times daily.     ferrous sulfate 325 (65 FE) MG tablet  Take 1 tablet (325 mg total) by mouth 3 (three) times daily after meals.     methocarbamol 500 MG tablet  Commonly known as:  ROBAXIN  Take 1 tablet (500 mg total) by mouth every 6 (six) hours as needed for muscle spasms.     omeprazole 20 MG tablet  Commonly known as:  PRILOSEC OTC  Take 20 mg by mouth daily.     ondansetron 4 MG disintegrating tablet  Commonly known as:  ZOFRAN-ODT  Take 4 mg by mouth every 8 (eight) hours as needed for nausea or vomiting.     polyethylene glycol packet  Commonly known as:  MIRALAX / GLYCOLAX  Take 17 g by mouth 2 (two) times daily.     pregabalin 75 MG capsule  Commonly known as:  LYRICA  Take 1 capsule (75 mg total) by mouth 2 (two) times daily.     simvastatin 20 MG tablet  Commonly known as:  ZOCOR  Take 20 mg by mouth daily.     traMADol 50 MG tablet  Commonly known as:  ULTRAM  Take 1-2 tablets (50-100 mg total) by mouth every 6 (six) hours as needed for moderate pain.         Signed: Anastasio AuerbachMatthew S. Keon Pender   PA-C  05/09/2014, 1:47 PM

## 2015-02-17 ENCOUNTER — Other Ambulatory Visit: Payer: Self-pay | Admitting: Cardiology

## 2015-02-19 NOTE — Telephone Encounter (Signed)
Rx request sent to pharmacy.  

## 2015-02-19 NOTE — Telephone Encounter (Signed)
Ok to refill or defer to pcp? Last office visit states  Dyslipidemia, goal LDL below 130 - Mike Lexavid W Harding, MD at 02/02/2014 10:50 PM     Status: Written Related Problem: Dyslipidemia, goal LDL below 130   Expand All Collapse All   On statin. Monitored by PCP.      Please advise. Thanks, MI

## 2015-03-30 ENCOUNTER — Other Ambulatory Visit: Payer: Self-pay | Admitting: Cardiology

## 2015-04-02 NOTE — Telephone Encounter (Signed)
Rx(s) sent to pharmacy electronically.  

## 2015-10-29 DIAGNOSIS — M7542 Impingement syndrome of left shoulder: Secondary | ICD-10-CM | POA: Diagnosis not present

## 2015-11-08 ENCOUNTER — Other Ambulatory Visit: Payer: Self-pay | Admitting: Ophthalmology

## 2015-11-08 DIAGNOSIS — M7542 Impingement syndrome of left shoulder: Secondary | ICD-10-CM

## 2015-11-14 DIAGNOSIS — M545 Low back pain: Secondary | ICD-10-CM | POA: Diagnosis not present

## 2015-11-22 ENCOUNTER — Ambulatory Visit
Admission: RE | Admit: 2015-11-22 | Discharge: 2015-11-22 | Disposition: A | Discharge status: Home / Self Care | Payer: BLUE CROSS/BLUE SHIELD | Source: Ambulatory Visit | Attending: Ophthalmology | Admitting: Ophthalmology

## 2015-11-22 DIAGNOSIS — M7542 Impingement syndrome of left shoulder: Secondary | ICD-10-CM | POA: Diagnosis not present

## 2015-11-22 DIAGNOSIS — M7582 Other shoulder lesions, left shoulder: Secondary | ICD-10-CM | POA: Diagnosis not present

## 2015-11-22 MED ORDER — IOPAMIDOL (ISOVUE-M 200) INJECTION 41%
15.0000 mL | Freq: Once | INTRAMUSCULAR | Status: AC
  Administered 2015-11-22: 15 mL via INTRA_ARTICULAR

## 2015-11-23 ENCOUNTER — Other Ambulatory Visit (HOSPITAL_COMMUNITY): Payer: Self-pay | Admitting: Specialist

## 2015-11-23 ENCOUNTER — Other Ambulatory Visit: Payer: Self-pay | Admitting: Specialist

## 2015-11-23 DIAGNOSIS — M7542 Impingement syndrome of left shoulder: Secondary | ICD-10-CM

## 2015-12-05 DIAGNOSIS — M25512 Pain in left shoulder: Secondary | ICD-10-CM | POA: Diagnosis not present

## 2015-12-26 DIAGNOSIS — M25512 Pain in left shoulder: Secondary | ICD-10-CM | POA: Diagnosis not present

## 2016-01-31 ENCOUNTER — Encounter: Payer: Self-pay | Admitting: Physician Assistant

## 2016-01-31 ENCOUNTER — Ambulatory Visit (INDEPENDENT_AMBULATORY_CARE_PROVIDER_SITE_OTHER): Payer: BLUE CROSS/BLUE SHIELD | Admitting: Physician Assistant

## 2016-01-31 VITALS — BP 110/74 | HR 70 | Ht 71.5 in | Wt 164.8 lb

## 2016-01-31 DIAGNOSIS — I4891 Unspecified atrial fibrillation: Secondary | ICD-10-CM

## 2016-01-31 LAB — BASIC METABOLIC PANEL WITH GFR
BUN: 17 mg/dL (ref 7–25)
CO2: 28 mmol/L (ref 20–31)
Calcium: 10 mg/dL (ref 8.6–10.3)
Chloride: 104 mmol/L (ref 98–110)
Creat: 1.03 mg/dL (ref 0.70–1.33)
GFR, EST NON AFRICAN AMERICAN: 81 mL/min (ref >=60)
Glucose, Bld: 77 mg/dL (ref 65–99)
POTASSIUM: 4.6 mmol/L (ref 3.5–5.3)
SODIUM: 140 mmol/L (ref 135–146)

## 2016-01-31 LAB — CBC
HCT: 45.4 % (ref 38.5–50.0)
HEMOGLOBIN: 15.7 g/dL (ref 13.2–17.1)
MCH: 32.6 pg (ref 27.0–33.0)
MCHC: 34.6 g/dL (ref 32.0–36.0)
MCV: 94.2 fL (ref 80.0–100.0)
MPV: 9.6 fL (ref 7.5–12.5)
PLATELETS: 213 10*3/uL (ref 140–400)
RBC: 4.82 MIL/uL (ref 4.20–5.80)
RDW: 13.1 % (ref 11.0–15.0)
WBC: 5.3 10*3/uL (ref 3.8–10.8)

## 2016-01-31 LAB — TSH: TSH: 4.73 m[IU]/L — AB (ref 0.40–4.50)

## 2016-01-31 MED ORDER — RIVAROXABAN 20 MG PO TABS: 20.0000 mg | ORAL_TABLET | Freq: Every day | ORAL | 6 refills | Status: DC

## 2016-01-31 NOTE — Patient Instructions (Addendum)
Medications:  START Xarelto (Rivaroxaban) 20 mg daily.   Testing/Procedures:  Your physician has requested that you have a TEE/Cardioversion. During a TEE, sound waves are used to create images of your heart. It provides your doctor with information about the size and shape of your heart and how well your heart's chambers and valves are working. In this test, a transducer is attached to the end of a flexible tube that is guided down you throat and into your esophagus (the tube leading from your mouth to your stomach) to get a more detailed image of your heart. Once the TEE has determined that a blood clot is not present, the cardioversion begins. Electrical Cardioversion uses a jolt of electricity to your heart either through paddles or wired patches attached to your chest. This is a controlled, usually prescheduled, procedure. This procedure is done at the hospital and you are not awake during the procedure. You usually go home the day of the procedure. Please see the instruction sheet given to you today for more information.   Labwork:  Your physician recommends that you return for lab work today--CBC,INR,TSH,BMET   Follow-Up:  Schedule a follow-up appointment with Dr. Herbie BaltimoreHarding after your cardioversion.

## 2016-01-31 NOTE — Progress Notes (Signed)
Cardiology Office Note   Date:  01/31/2016   ID:  Mike Mccann 1961-04-22, MRN [id number]  PCP:  Ezequiel Kayser, MD  Cardiologist:  Dr Herbie Baltimore 02/2014 Theodore Demark, PA-C   Chief Complaint  Patient presents with  . Follow-up    pt states He passed out saturday night--became diaphoretic and lightheaded, HR was 40, sitting did not help. Called 911, EMT took HR and it was back up to 50. Second time this has occurred in 2 years. C/o leg cramps with 40 mg simvastatin and occasional irregular beats when he is stressed. Pt denies CP, SOB, and swelling.    History of Present Illness: Mike Mccann is a 55 y.o. male with a history of CP> echo 2013 w/ mild-mod biatrial dilat, EF 55%, MV 2014 w/ diaphragmatic attn, EF 44%, hx tachycardia (never caught), GERD, HLD.   Mike Mccann presents for evaluation of syncope, palpitations  He has had intermittent palpitations and tachycardia for several years. The episodes were brief and never caught on ECG. He was not terribly symptomatic with them. However recently, he has had longer duration of just not feeling well, and feeling his heart beat out of rhythm at times. He has not had problems with tachycardia.  Starting in the spring, he would have episodes where he did not feel well but then would feel normal for a while. However, over the last couple of months, he has felt a little bit bad all the time. His heart rate has been running higher than usual, in the 60s and 70s. He has been able to exercise without difficulty. He has been waking up with a headache most days of the week. This is treated with Aleve with success.  This weekend, he had not done anything unusual and had not had anything unusual to eat and drink. He had not had a large amount of alcohol. He began feeling bad, he became diaphoretic and lightheaded. He noted on his feet that that his heart rate was 40. He sat down but did not feel much better. He had a syncopal episode. It did not  last very long. EMS was called. Prior to EMS arrival, his heart rate was back up to 50 and he felt much better. He was not transported and no ECG is available. He did not document any heart rates < 40.   He gets occasional nosebleeds in the winter months, and he uses saline nasal spray to try and prevent those. He has no other bleeding issues. He has had no chest pain or shortness of breath. He's had no increased dyspnea on exertion.   Past Medical History:  Diagnosis Date  . Abnormal finding on EKG October 2013    a) 2-D Echo, Mod RA& mild LA dilation, RVSP 33 mmHg. Nl LV function - EF > 55%. No valvular dz;; b) Myoview: ~ mild basal-mid inferoseptal ischemia: A low risk scan with excellent exercise capacity of 15 METS, exercising for 13 min, but horizontal ST depression in Lat leads;; c) repeat Myoview 02/2013: LOW RISK study w/ diaphragmatic attenuation   . Arthritis    hip  . Complication of anesthesia    given more medications than usual-"unsure if that happend"  . Dyslipidemia, goal LDL below 130    On statin, "ratio is really off"  . Dysrhythmia    tachycardia  . GERD (gastroesophageal reflux disease)   . Tachycardia, paroxysmal (HCC)    No true diagnosis by event monitor  Past Surgical History:  Procedure Laterality Date  . COLONOSCOPY  2 years ago  . TOTAL HIP ARTHROPLASTY Left 04/04/2014   Procedure: LEFT TOTAL HIP ARTHROPLASTY ANTERIOR APPROACH;  Surgeon: [name], MD;  Location: [facility];  Service: Orthopedics;  Laterality: Left;  . TOTAL HIP ARTHROPLASTY Right 05/02/2014   Procedure: TOTAL RIGHT HIP ARTHROPLASTY ANTERIOR APPROACH;  Surgeon: [name], MD;  Location: [facility];  Service: Orthopedics;  Laterality: Right;  [name] VASECTOMY  8 years ago  . WISDOM TOOTH EXTRACTION  10 years ago    Current Outpatient Prescriptions  Medication Sig Dispense Refill  . Coenzyme Q10 (CO Q 10 PO) Take 1 tablet by mouth daily.    . Multiple Vitamins-Minerals (MULTIVITAMIN ADULT  PO) Take 1 tablet by mouth daily.    [name] omeprazole (PRILOSEC OTC) 20 MG tablet Take 20 mg by mouth as needed.     . simvastatin (ZOCOR) 40 MG tablet Take 20 mg by mouth daily.  2  . rivaroxaban (XARELTO) 20 MG TABS tablet Take 1 tablet (20 mg total) by mouth daily with supper. 30 tablet 6   No current facility-administered medications for this visit.     Allergies:   Hydrocodone-acetaminophen    Social History:  The patient  reports that he has never smoked. He has never used smokeless tobacco. He reports that he drinks alcohol. He reports that he does not use drugs.   Family History:  The patient's family history includes Heart attack in his maternal grandfather and maternal uncle.    ROS:  Please see the history of present illness. All other systems are reviewed and negative.    PHYSICAL EXAM: VS:  BP 110/74 (BP Location: Left Arm, Patient Position: Sitting, Cuff Size: Normal)   Pulse 70   Ht 5' 11.5" (1.816 m)   Wt 164 lb 12.8 oz (74.8 kg)   BMI 22.66 kg/m  , BMI Body mass index is 22.66 kg/m. GEN: Well nourished, well developed, male in no acute distress  HEENT: normal for age  Neck: no JVD, no carotid bruit, no masses Cardiac: Irreg R&R; no murmur, no rubs, or gallops Respiratory:  clear to auscultation bilaterally, normal work of breathing GI: soft, nontender, nondistended, + BS MS: no deformity or atrophy; no edema; distal pulses are 2+ in all 4 extremities   Skin: warm and dry, no rash Neuro:  Strength and sensation are intact Psych: euthymic mood, full affect   EKG:  EKG is ordered today. The ekg ordered today demonstrates atrial fibrillation, heart rate 70, incomplete right bundle The only difference is the rhythm change, previous ECG was sinus  Recent Labs: No results found for requested labs within last 8760 hours.    Lipid Panel No results found for: CHOL, TRIG, HDL, CHOLHDL, VLDL, LDLCALC, LDLDIRECT   Wt Readings from Last 3 Encounters:  01/31/16 164 lb  12.8 oz (74.8 kg)  05/05/14 163 lb (73.9 kg)  05/02/14 169 lb (76.7 kg)     Other studies Reviewed: Additional studies/ records that were reviewed today include: Office notes and testing.  ASSESSMENT AND PLAN:  1.  Atrial fibrillation: Duration is unknown but probably 2 months or more. He is generally not that symptomatic and has been able to maintain his usual activity level. However, he does experience symptoms with the atrial fibrillation all the time, and had a syncopal episode last weekend.  Therefore, it is appropriate to pursue sinus rhythm. The options were discussed with the patient including risks and  benefits. He elects to do the TEE cardioversion. His wife is a Careers advisersurgeon but will be able to change her scheduled next week and we will schedule it then.   Hopefully Dr Elease HashimotoNahser will assess atrial sizes at the TEE, that will help determine need for antiarrhythmic therapy.  Until then, it is okay for him to maintain his current activity level as long as his heart rate is appropriate to his activity level. He has not had any further episodes of bradycardia. He was advised that he should not drive.   Because his heart rate and blood pressure are low-normal at baseline, I will not add a beta blocker or calcium channel blocker.  M.D. advise if we should start an antiarrhythmic such as flecainide or propafenone to help him maintain sinus rhythm.  2. Anticoagulation: CHA2DS2VASc=0, he is less than 597 years old, he has no history of diabetes, hypertension, CHF, CAD or CVA, and is not male.  He will be started on Xarelto 20 mg daily was advised to take this daily without fail. We discussed the bleeding risks, but he thinks these are manageable.  Duration of therapy will be brief, M.D. to determine the duration of therapy after the cardioversion.   Current medicines are reviewed at length with the patient today.  The patient does not have concerns regarding medicines.  The following changes  have been made:  Add Xarelto  Labs/ tests ordered today include:   Orders Placed This Encounter  Procedures  . CBC  . TSH  . BASIC METABOLIC PANEL WITH GFR  . Protime-INR  . EKG 12-Lead     Disposition:   FU with Dr. Herbie BaltimoreHarding  Signed, Ermal Haberer, Bjorn LoserRhonda, PA-C  01/31/2016 9:10 AM    [facility] Phone: [phone]([phone]; Fax: [phone]  This note was written with the assistance of speech recognition software. Please excuse any transcriptional errors.

## 2016-02-01 LAB — PROTIME-INR
INR: 1.1
PROTHROMBIN TIME: 11.2 s (ref 9.0–11.5)

## 2016-02-04 ENCOUNTER — Telehealth: Payer: Self-pay | Admitting: Cardiovascular Disease

## 2016-02-04 NOTE — Telephone Encounter (Signed)
He should take his Xarelto the way he usually takes it. The intention is for him not to miss any doses.  Bryan Lemmaavid Harding, MD

## 2016-02-04 NOTE — Telephone Encounter (Signed)
This is Dr. Elissa HeftyHarding's patient

## 2016-02-04 NOTE — Telephone Encounter (Signed)
New Message  Pt c/o medication issue:  1. Name of Medication: Xarelto  2. How are you currently taking this medication (dosage and times per day)? 20 mg by mouth daily with supper  3. Are you having a reaction (difficulty breathing--STAT)? No  4. What is your medication issue? Pt voiced he is having procedure tomorrow and was informed to take Xarelto prior to but he takes it at night with his meal and wanting to know if that'll be an issue.  Pt voiced wanting to know the location as well.  Please f/u wit pt

## 2016-02-04 NOTE — Telephone Encounter (Signed)
Pt is calling to go over his TEE/Cardioversion instruction letter.  Pt is scheduled for a TEE/Cardioversion for tomorrow 02/05/16 at 0830 with Dr Elease HashimotoNahser.  Pt was asking should he take his Xarelto tonight, as he usually takes this medication, or should he take this medicine in the morning instead.  Advised the pt that he should take his scheduled dose of Xarelto for this evening with supper, and then his next dose of this medication will be as scheduled for tomorrow night, at the same time, unless otherwise advised on discharge from his procedure tomorrow.  Reiterated to the pt that he is to take Xarelto 20 mg po daily with supper. Pt education provided on the importance to take all scheduled meds at the same time everyday, especially Xarelto.  Also went over the rest of instructions needed for preparation for his TEE/Cardioversion for tomorrow, with the pt over the phone.  Pt verbalized understanding and agrees with plan mentioned above.  Pt does have instruction letter in hand.  Pt just wanted to clarify how to take his Xarelto.  Pt gracious for all the assistance provided.

## 2016-02-05 ENCOUNTER — Telehealth: Payer: Self-pay

## 2016-02-05 ENCOUNTER — Ambulatory Visit (HOSPITAL_COMMUNITY): Payer: BLUE CROSS/BLUE SHIELD | Admitting: Anesthesiology

## 2016-02-05 ENCOUNTER — Encounter (HOSPITAL_COMMUNITY)
Admission: RE | Disposition: A | Discharge status: Home / Self Care | Payer: Self-pay | Source: Ambulatory Visit | Attending: Cardiovascular Disease

## 2016-02-05 ENCOUNTER — Other Ambulatory Visit (HOSPITAL_COMMUNITY): Payer: BLUE CROSS/BLUE SHIELD

## 2016-02-05 ENCOUNTER — Encounter (HOSPITAL_COMMUNITY): Payer: Self-pay | Admitting: Anesthesiology

## 2016-02-05 ENCOUNTER — Ambulatory Visit (HOSPITAL_COMMUNITY)
Admission: RE | Admit: 2016-02-05 | Discharge: 2016-02-05 | Disposition: A | Discharge status: Home / Self Care | Payer: BLUE CROSS/BLUE SHIELD | Source: Ambulatory Visit | Attending: Cardiovascular Disease | Admitting: Cardiovascular Disease

## 2016-02-05 DIAGNOSIS — Z7901 Long term (current) use of anticoagulants: Secondary | ICD-10-CM | POA: Diagnosis not present

## 2016-02-05 DIAGNOSIS — I4891 Unspecified atrial fibrillation: Secondary | ICD-10-CM

## 2016-02-05 DIAGNOSIS — K219 Gastro-esophageal reflux disease without esophagitis: Secondary | ICD-10-CM | POA: Diagnosis not present

## 2016-02-05 DIAGNOSIS — E785 Hyperlipidemia, unspecified: Secondary | ICD-10-CM | POA: Insufficient documentation

## 2016-02-05 DIAGNOSIS — I480 Paroxysmal atrial fibrillation: Secondary | ICD-10-CM

## 2016-02-05 DIAGNOSIS — Z5309 Procedure and treatment not carried out because of other contraindication: Secondary | ICD-10-CM | POA: Diagnosis not present

## 2016-02-05 DIAGNOSIS — Z79899 Other long term (current) drug therapy: Secondary | ICD-10-CM | POA: Insufficient documentation

## 2016-02-05 DIAGNOSIS — Z96643 Presence of artificial hip joint, bilateral: Secondary | ICD-10-CM | POA: Insufficient documentation

## 2016-02-05 SURGERY — CANCELLED PROCEDURE

## 2016-02-05 MED ORDER — SODIUM CHLORIDE 0.9 % IV SOLN: INTRAVENOUS | Status: DC

## 2016-02-05 NOTE — Interval H&P Note (Signed)
History and Physical Interval Note:  02/05/2016 10:03 AM  [name]  has presented today for surgery, with the diagnosis of afib  The various methods of treatment have been discussed with the patient and family. After consideration of risks, benefits and other options for treatment, the patient has consented to  Procedure(s): CARDIOVERSION (N/A) TRANSESOPHAGEAL ECHOCARDIOGRAM (TEE) (N/A) as a surgical intervention .  The patient's history has been reviewed, patient examined, no change in status, stable for surgery.  I have reviewed the patient's chart and labs.  Questions were answered to the patient's satisfaction.     Kristeen MissPhilip Kahleah Crass

## 2016-02-05 NOTE — Anesthesia Preprocedure Evaluation (Addendum)
Anesthesia Evaluation  Patient identified by MRN, date of birth, ID band Patient awake    Reviewed: Allergy & Precautions, NPO status , Patient's Chart, lab work & pertinent test results  Airway        Dental   Pulmonary           Cardiovascular      Neuro/Psych    GI/Hepatic   Endo/Other    Renal/GU      Musculoskeletal   Abdominal   Peds  Hematology   Anesthesia Other Findings   Reproductive/Obstetrics                             Anesthesia Physical Anesthesia Plan  ASA: III  Anesthesia Plan:    Post-op Pain Management:    Induction:   Airway Management Planned:   Additional Equipment:   Intra-op Plan:   Post-operative Plan:   Informed Consent:   Plan Discussed with:   Anesthesia Plan Comments:         Anesthesia Quick Evaluation

## 2016-02-05 NOTE — Progress Notes (Signed)
    Mike Mccann  had spontaneously converted back to NSR this am The TEE / CV has been cancelled  Discussed with him about ongoing monitoring - suggested the kardia monitor by [employer].  His CHADS2VASC score is 0 He is currently on Xarelto. I would suggest that he continue it for several more weeks . I'll set up an echo Will see him in the office - I've discussed [place] ( [name]) with Dr. Herbie BaltimoreHarding and he is agreeable .  I'll see him in 4-6 weeks for office visit      Kristeen MissPhilip Nahser, MD  02/05/2016 10:42 AM    [facility] [address],  [address] [place], [place]  [address] Pager [phone] Phone: [phone]) [phone]; Fax: [phone]) [phone]

## 2016-02-05 NOTE — Telephone Encounter (Signed)
Vesta MixerPhilip J Nahser, MD  Iona CoachLauren W Brown, RN        Leotis ShamesLauren, Will you please order an echo for Mr. Mike PilgrimJacob for the next week or so. He was scheduled for a TEE / CV but showed up in NSR this am and the procedure was cancelled.   Also will you see if I can work him into my schedule in 4-6 weeks .  He is going to transfer from [place] for follow up . Thanks       Left message on machine for pt to contact the office.

## 2016-02-05 NOTE — H&P (View-Only) (Signed)
Cardiology Office Note   Date:  01/31/2016   ID:  Mike, Mccann 1961-04-22, MRN [id number]  PCP:  Mike Kayser, MD  Cardiologist:  Dr Mike Mccann 02/2014 Mike Demark, PA-C   Chief Complaint  Patient presents with  . Follow-up    pt states He passed out saturday night--became diaphoretic and lightheaded, HR was 40, sitting did not help. Called 911, EMT took HR and it was back up to 50. Second time this has occurred in 2 years. C/o leg cramps with 40 mg simvastatin and occasional irregular beats when he is stressed. Pt denies CP, SOB, and swelling.    History of Present Illness: Mike Mccann is a 55 y.o. male with a history of CP> echo 2013 w/ mild-mod biatrial dilat, EF 55%, MV 2014 w/ diaphragmatic attn, EF 44%, hx tachycardia (never caught), GERD, HLD.   Mike Mccann presents for evaluation of syncope, palpitations  He has had intermittent palpitations and tachycardia for several years. The episodes were brief and never caught on ECG. He was not terribly symptomatic with them. However recently, he has had longer duration of just not feeling well, and feeling his heart beat out of rhythm at times. He has not had problems with tachycardia.  Starting in the spring, he would have episodes where he did not feel well but then would feel normal for a while. However, over the last couple of months, he has felt a little bit bad all the time. His heart rate has been running higher than usual, in the 60s and 70s. He has been able to exercise without difficulty. He has been waking up with a headache most days of the week. This is treated with Aleve with success.  This weekend, he had not done anything unusual and had not had anything unusual to eat and drink. He had not had a large amount of alcohol. He began feeling bad, he became diaphoretic and lightheaded. He noted on his feet that that his heart rate was 40. He sat down but did not feel much better. He had a syncopal episode. It did not  last very long. EMS was called. Prior to EMS arrival, his heart rate was back up to 50 and he felt much better. He was not transported and no ECG is available. He did not document any heart rates < 40.   He gets occasional nosebleeds in the winter months, and he uses saline nasal spray to try and prevent those. He has no other bleeding issues. He has had no chest pain or shortness of breath. He's had no increased dyspnea on exertion.   Past Medical History:  Diagnosis Date  . Abnormal finding on EKG October 2013    a) 2-D Echo, Mod RA& mild LA dilation, RVSP 33 mmHg. Nl LV function - EF > 55%. No valvular dz;; b) Myoview: ~ mild basal-mid inferoseptal ischemia: A low risk scan with excellent exercise capacity of 15 METS, exercising for 13 min, but horizontal ST depression in Lat leads;; c) repeat Myoview 02/2013: LOW RISK study w/ diaphragmatic attenuation   . Arthritis    hip  . Complication of anesthesia    given more medications than usual-"unsure if that happend"  . Dyslipidemia, goal LDL below 130    On statin, "ratio is really off"  . Dysrhythmia    tachycardia  . GERD (gastroesophageal reflux disease)   . Tachycardia, paroxysmal (HCC)    No true diagnosis by event monitor  Past Surgical History:  Procedure Laterality Date  . COLONOSCOPY  2 years ago  . TOTAL HIP ARTHROPLASTY Left 04/04/2014   Procedure: LEFT TOTAL HIP ARTHROPLASTY ANTERIOR APPROACH;  Surgeon: [name], MD;  Location: [facility];  Service: Orthopedics;  Laterality: Left;  . TOTAL HIP ARTHROPLASTY Right 05/02/2014   Procedure: TOTAL RIGHT HIP ARTHROPLASTY ANTERIOR APPROACH;  Surgeon: [name], MD;  Location: [facility];  Service: Orthopedics;  Laterality: Right;  [name] VASECTOMY  8 years ago  . WISDOM TOOTH EXTRACTION  10 years ago    Current Outpatient Prescriptions  Medication Sig Dispense Refill  . Coenzyme Q10 (CO Q 10 PO) Take 1 tablet by mouth daily.    . Multiple Vitamins-Minerals (MULTIVITAMIN ADULT  PO) Take 1 tablet by mouth daily.    [name] omeprazole (PRILOSEC OTC) 20 MG tablet Take 20 mg by mouth as needed.     . simvastatin (ZOCOR) 40 MG tablet Take 20 mg by mouth daily.  2  . rivaroxaban (XARELTO) 20 MG TABS tablet Take 1 tablet (20 mg total) by mouth daily with supper. 30 tablet 6   No current facility-administered medications for this visit.     Allergies:   Hydrocodone-acetaminophen    Social History:  The patient  reports that he has never smoked. He has never used smokeless tobacco. He reports that he drinks alcohol. He reports that he does not use drugs.   Family History:  The patient's family history includes Heart attack in his maternal grandfather and maternal uncle.    ROS:  Please see the history of present illness. All other systems are reviewed and negative.    PHYSICAL EXAM: VS:  BP 110/74 (BP Location: Left Arm, Patient Position: Sitting, Cuff Size: Normal)   Pulse 70   Ht 5' 11.5" (1.816 m)   Wt 164 lb 12.8 oz (74.8 kg)   BMI 22.66 kg/m  , BMI Body mass index is 22.66 kg/m. GEN: Well nourished, well developed, male in no acute distress  HEENT: normal for age  Neck: no JVD, no carotid bruit, no masses Cardiac: Irreg R&R; no murmur, no rubs, or gallops Respiratory:  clear to auscultation bilaterally, normal work of breathing GI: soft, nontender, nondistended, + BS MS: no deformity or atrophy; no edema; distal pulses are 2+ in all 4 extremities   Skin: warm and dry, no rash Neuro:  Strength and sensation are intact Psych: euthymic mood, full affect   EKG:  EKG is ordered today. The ekg ordered today demonstrates atrial fibrillation, heart rate 70, incomplete right bundle The only difference is the rhythm change, previous ECG was sinus  Recent Labs: No results found for requested labs within last 8760 hours.    Lipid Panel No results found for: CHOL, TRIG, HDL, CHOLHDL, VLDL, LDLCALC, LDLDIRECT   Wt Readings from Last 3 Encounters:  01/31/16 164 lb  12.8 oz (74.8 kg)  05/05/14 163 lb (73.9 kg)  05/02/14 169 lb (76.7 kg)     Other studies Reviewed: Additional studies/ records that were reviewed today include: Office notes and testing.  ASSESSMENT AND PLAN:  1.  Atrial fibrillation: Duration is unknown but probably 2 months or more. He is generally not that symptomatic and has been able to maintain his usual activity level. However, he does experience symptoms with the atrial fibrillation all the time, and had a syncopal episode last weekend.  Therefore, it is appropriate to pursue sinus rhythm. The options were discussed with the patient including risks and  benefits. He elects to do the TEE cardioversion. His wife is a Careers advisersurgeon but will be able to change her scheduled next week and we will schedule it then.   Hopefully Dr Elease HashimotoNahser will assess atrial sizes at the TEE, that will help determine need for antiarrhythmic therapy.  Until then, it is okay for him to maintain his current activity level as long as his heart rate is appropriate to his activity level. He has not had any further episodes of bradycardia. He was advised that he should not drive.   Because his heart rate and blood pressure are low-normal at baseline, I will not add a beta blocker or calcium channel blocker.  M.D. advise if we should start an antiarrhythmic such as flecainide or propafenone to help him maintain sinus rhythm.  2. Anticoagulation: CHA2DS2VASc=0, he is less than 597 years old, he has no history of diabetes, hypertension, CHF, CAD or CVA, and is not male.  He will be started on Xarelto 20 mg daily was advised to take this daily without fail. We discussed the bleeding risks, but he thinks these are manageable.  Duration of therapy will be brief, M.D. to determine the duration of therapy after the cardioversion.   Current medicines are reviewed at length with the patient today.  The patient does not have concerns regarding medicines.  The following changes  have been made:  Add Xarelto  Labs/ tests ordered today include:   Orders Placed This Encounter  Procedures  . CBC  . TSH  . BASIC METABOLIC PANEL WITH GFR  . Protime-INR  . EKG 12-Lead     Disposition:   FU with Dr. Herbie BaltimoreHarding  Signed, Barrett, Bjorn LoserRhonda, PA-C  01/31/2016 9:10 AM    [facility] Phone: [phone](336) [phone]; Fax: [phone]  This note was written with the assistance of speech recognition software. Please excuse any transcriptional errors.

## 2016-02-05 NOTE — Progress Notes (Signed)
Patient arrived and heart found to be in sinus rhthym.  EKG obtained and Dr Elease HashimotoNahser notified.  MD speaking with family at bedside; pt to be discharged home

## 2016-02-06 NOTE — Telephone Encounter (Signed)
I spoke with the pt and his echo and OV have been scheduled.

## 2016-02-08 ENCOUNTER — Encounter: Payer: Self-pay | Admitting: Cardiovascular Disease

## 2016-02-08 ENCOUNTER — Telehealth: Payer: Self-pay | Admitting: Cardiovascular Disease

## 2016-02-08 NOTE — Telephone Encounter (Signed)
Spoke with patient and advised that the cardiac monitor readings that he sent show NSR per Dr. Elease HashimotoNahser.  He states earlier today he felt dizzy and felt like his heart was racing.  I advised him to continue to monitor symptoms and report if he notices worsening symptoms.  I also advised him to send monitor readings any time he feels abnormalities.  He denies symptoms at present and states he is going for a walk.  He thanked me for the call.

## 2016-02-08 NOTE — Telephone Encounter (Signed)
Mike Mccann is calling because he was instructed to send a message if he had a bad reading from his little heart monitor. He doesn't feel well he is a little dizzy with rapid heart rate.

## 2016-02-11 ENCOUNTER — Encounter: Payer: Self-pay | Admitting: Cardiovascular Disease

## 2016-02-12 ENCOUNTER — Encounter: Payer: Self-pay | Admitting: Cardiovascular Disease

## 2016-02-13 ENCOUNTER — Encounter (HOSPITAL_COMMUNITY): Payer: Self-pay | Admitting: Nurse Practitioner

## 2016-02-13 ENCOUNTER — Ambulatory Visit (HOSPITAL_COMMUNITY)
Admission: RE | Admit: 2016-02-13 | Discharge: 2016-02-13 | Disposition: A | Discharge status: Home / Self Care | Payer: BLUE CROSS/BLUE SHIELD | Source: Ambulatory Visit | Attending: Nurse Practitioner | Admitting: Nurse Practitioner

## 2016-02-13 VITALS — BP 118/82 | HR 58 | Ht 71.5 in | Wt 164.6 lb

## 2016-02-13 DIAGNOSIS — I480 Paroxysmal atrial fibrillation: Secondary | ICD-10-CM | POA: Diagnosis not present

## 2016-02-13 DIAGNOSIS — R55 Syncope and collapse: Secondary | ICD-10-CM

## 2016-02-13 DIAGNOSIS — I4891 Unspecified atrial fibrillation: Secondary | ICD-10-CM | POA: Diagnosis present

## 2016-02-13 DIAGNOSIS — Z7901 Long term (current) use of anticoagulants: Secondary | ICD-10-CM | POA: Diagnosis not present

## 2016-02-13 DIAGNOSIS — E785 Hyperlipidemia, unspecified: Secondary | ICD-10-CM | POA: Insufficient documentation

## 2016-02-13 DIAGNOSIS — R946 Abnormal results of thyroid function studies: Secondary | ICD-10-CM | POA: Diagnosis not present

## 2016-02-13 DIAGNOSIS — R001 Bradycardia, unspecified: Secondary | ICD-10-CM | POA: Insufficient documentation

## 2016-02-13 DIAGNOSIS — K219 Gastro-esophageal reflux disease without esophagitis: Secondary | ICD-10-CM | POA: Insufficient documentation

## 2016-02-13 DIAGNOSIS — M1990 Unspecified osteoarthritis, unspecified site: Secondary | ICD-10-CM | POA: Diagnosis not present

## 2016-02-13 HISTORY — DX: Tachycardia, unspecified: R00.0

## 2016-02-13 HISTORY — DX: Paroxysmal atrial fibrillation: I48.0

## 2016-02-13 NOTE — Patient Instructions (Signed)
Scheduler will be in touch with you regarding exercise test and follow up appointment with Dr. Johney FrameAllred in 3 weeks.  Dr. Johney FrameAllred ordered labwork to be done when you go for your echo on Monday. Stop by the lab to have this drawn.

## 2016-02-13 NOTE — Progress Notes (Signed)
  Primary Care Physician: PERINI,MARK A, MD Primary Cardiologist: Dr Nahser Primary Electrophysiologist: DR Dequante Tremaine Referring Physician: Dr Nahser   Mike Mccann is a 55 y.o. male with a history of recurrent symptomatic palpitations of unclear origin.  He was followed by Dr Weintraub and workup was unrevealing.  He swims, rides his bike and plays tennis without difficulty. About a month ago, he developed fatigue and decreased exercise tolerance of unclear significance.  This persisted for about two weeks.  He was hosting a party and at his house when he had abrupt onset of syncope.  He was unconscious for only several seconds.  He presented 01/31/16 and was found to be in afib.  He continued to feel quite fatigued.  He was scheduled for cardioversion 02/05/16 but converted to sinus 10/2 with resolution of symptoms.   Over the past week, he has had several episodes of afib which he has documented on [facility]. He is clearly symptomatic.  He denies any further presyncope or syncope.  Today, he denies symptoms of palpitations, chest pain, shortness of breath, orthopnea, PND, lower extremity edema, dizziness, presyncope, syncope, snoring, daytime somnolence, bleeding, or neurologic sequela. The patient is tolerating medications without difficulties and is otherwise without complaint today.    Atrial Fibrillation Risk Factors:  he does not have symptoms or diagnosis of sleep apnea.  he does not have a history of rheumatic fever.  he does not have a history of alcohol use.  he has a BMI of Body mass index is 22.64 kg/m.. Filed Weights   02/13/16 1705  Weight: 164 lb 9.6 oz (74.7 kg)    LA size: pending   Atrial Fibrillation Management history:  Previous antiarrhythmic drugs: none  Previous cardioversions: none  Previous ablations: none  CHADS2VASC score: 0  Anticoagulation history: xarelto currently   Past Medical History:  Diagnosis Date  . Abnormal finding on EKG October  2013    a) 2-D Echo, Mod RA& mild LA dilation, RVSP 33 mmHg. Nl LV function - EF > 55%. No valvular dz;; b) Myoview: ~ mild basal-mid inferoseptal ischemia: A low risk scan with excellent exercise capacity of 15 METS, exercising for 13 min, but horizontal ST depression in Lat leads;; c) repeat Myoview 02/2013: LOW RISK study w/ diaphragmatic attenuation   . Arthritis    hip  . Complication of anesthesia    given more medications than usual-"unsure if that happend"  . Dyslipidemia, goal LDL below 130    On statin, "ratio is really off"  . GERD (gastroesophageal reflux disease)   . Paroxysmal atrial fibrillation (HCC)   . Tachycardia    Past Surgical History:  Procedure Laterality Date  . COLONOSCOPY  2 years ago  . TOTAL HIP ARTHROPLASTY Left 04/04/2014   Procedure: LEFT TOTAL HIP ARTHROPLASTY ANTERIOR APPROACH;  Surgeon: Matthew D Olin, MD;  Location: [facility];  Service: Orthopedics;  Laterality: Left;  . TOTAL HIP ARTHROPLASTY Right 05/02/2014   Procedure: TOTAL RIGHT HIP ARTHROPLASTY ANTERIOR APPROACH;  Surgeon: Matthew D Olin, MD;  Location: [facility];  Service: Orthopedics;  Laterality: Right;  . VASECTOMY  8 years ago  . WISDOM TOOTH EXTRACTION  10 years ago    Current Outpatient Prescriptions  Medication Sig Dispense Refill  . Coenzyme Q10 (CO Q 10 PO) Take 1 tablet by mouth daily.    . Multiple Vitamins-Minerals (MULTIVITAMIN ADULT PO) Take 1 tablet by mouth daily.    . omeprazole (PRILOSEC OTC) 20 MG tablet Take 20 mg by mouth   as needed.     . rivaroxaban (XARELTO) 20 MG TABS tablet Take 1 tablet (20 mg total) by mouth daily with supper. 30 tablet 6  . simvastatin (ZOCOR) 40 MG tablet Take 20 mg by mouth daily.  2   No current facility-administered medications for this encounter.     No Active Allergies  Social History   Social History  . Marital status: Married    Spouse name: N/A  . Number of children: N/A  . Years of education: N/A   Occupational History  . Not on  file.   Social History Main Topics  . Smoking status: Never Smoker  . Smokeless tobacco: Never Used  . Alcohol use Yes     Comment: wine once a month  . Drug use: No  . Sexual activity: Not on file   Other Topics Concern  . Not on file   Social History Narrative   Married father of 2. 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   He [place] in the [place] subdivision and swims in the outside pool in the summer and at the [employer] during the wintertime. He also rides his bicycle when his hips are not bothering him.  Married to Sera Accardo (ENT) MD    Family History  Problem Relation Age of Onset  . Heart attack Maternal Grandfather   . Heart attack Maternal Uncle    The patient does not have a history of early familial atrial fibrillation or other arrhythmias.  ROS- All systems are reviewed and negative except as per the HPI above.  Physical Exam: Vitals:   02/13/16 1705  BP: 118/82  Weight: 164 lb 9.6 oz (74.7 kg)  Height: 5' 11.5" (1.816 m)     GEN- The patient is well appearing, alert and oriented x 3 today.   Head- normocephalic, atraumatic Eyes-  Sclera clear, conjunctiva pink Ears- hearing intact Oropharynx- clear Neck- supple  Lungs- Clear to ausculation bilaterally, normal work of breathing Heart- Regular rate and rhythm, no murmurs, rubs or gallops  GI- soft, NT, ND, + BS Extremities- no clubbing, cyanosis, or edema MS- no significant deformity or atrophy Skin- no rash or lesion Psych- euthymic mood, full affect Neuro- strength and sensation are intact  Wt Readings from Last 3 Encounters:  02/13/16 164 lb 9.6 oz (74.7 kg)  01/31/16 164 lb 12.8 oz (74.8 kg)  05/05/14 163 lb (73.9 kg)    EKG today demonstrates sinus rhythm 58 bpm, PR 182 msec, QRS 98 msec, QTc 406 msec, rsr', LVH Echo is pending  Epic records are reviewed at length today  Assessment and Plan:  1. paroxysmal atrial fibrillation The patient has Symptomatic  paroxysmal atrial fibrillation.  The patients CHAD2VASC score is 0.  He is presently on xarelto. I have reviewed his [facility] printouts today which clearly show atrial fibrillation.  He is young and not very interested in medical therapy long term.  He also has sinus bradycardia which may limit treatment. Therapeutic strategies for afib including medicine and ablation were discussed in detail with the patient today. Risk, benefits, and alternatives to EP study and radiofrequency ablation for afib were also discussed in detail today.  At this time, he would like to further contemplate his options.  We discussed flecainide with diltiazem and multaq as AAD options.  I worry that with bradycardia, he may not tolerate these medicines long term.  I suspect that ablation may be his best option. He will return to see me   in 3 weeks for further discussion. Echo is ordered  2. Syncope By history, I am suspicious for post termination pause as the cause.  Echo is pending.  I will also obtain an ETT to exclude ischemia/ exercise induced arrhythmias.  No driving x 6 months (Pt aware).  3. Abnormal TSH Check free T4  Return to see me in 3 weeks If ETT is low risk, ok to resume regular exercise   Deke Alexandru Moorer, MD 02/13/2016 5:06 PM  

## 2016-02-14 ENCOUNTER — Other Ambulatory Visit (HOSPITAL_COMMUNITY): Payer: Self-pay | Admitting: *Deleted

## 2016-02-14 ENCOUNTER — Encounter (INDEPENDENT_AMBULATORY_CARE_PROVIDER_SITE_OTHER): Payer: Self-pay

## 2016-02-14 DIAGNOSIS — I480 Paroxysmal atrial fibrillation: Secondary | ICD-10-CM

## 2016-02-18 ENCOUNTER — Other Ambulatory Visit: Payer: Self-pay

## 2016-02-18 ENCOUNTER — Other Ambulatory Visit: Payer: BLUE CROSS/BLUE SHIELD | Admitting: *Deleted

## 2016-02-18 ENCOUNTER — Ambulatory Visit (HOSPITAL_COMMUNITY): Payer: BLUE CROSS/BLUE SHIELD | Attending: Cardiology

## 2016-02-18 DIAGNOSIS — I253 Aneurysm of heart: Secondary | ICD-10-CM | POA: Insufficient documentation

## 2016-02-18 DIAGNOSIS — I071 Rheumatic tricuspid insufficiency: Secondary | ICD-10-CM | POA: Diagnosis not present

## 2016-02-18 DIAGNOSIS — I4891 Unspecified atrial fibrillation: Secondary | ICD-10-CM | POA: Diagnosis not present

## 2016-02-18 DIAGNOSIS — I479 Paroxysmal tachycardia, unspecified: Secondary | ICD-10-CM

## 2016-02-18 DIAGNOSIS — I480 Paroxysmal atrial fibrillation: Secondary | ICD-10-CM

## 2016-02-18 LAB — T4, FREE: Free T4: 1.4 ng/dL (ref 0.8–1.8)

## 2016-02-19 ENCOUNTER — Encounter: Payer: Self-pay | Admitting: Internal Medicine

## 2016-02-20 ENCOUNTER — Ambulatory Visit (INDEPENDENT_AMBULATORY_CARE_PROVIDER_SITE_OTHER): Payer: BLUE CROSS/BLUE SHIELD

## 2016-02-20 DIAGNOSIS — I480 Paroxysmal atrial fibrillation: Secondary | ICD-10-CM | POA: Diagnosis not present

## 2016-02-20 LAB — EXERCISE TOLERANCE TEST
CSEPED: 11 min
CSEPEW: 13.4 METS
Exercise duration (sec): 0 s
MPHR: 164 {beats}/min
Peak HR: 153 {beats}/min
Percent HR: 93 %
RPE: 15
Rest HR: 55 {beats}/min

## 2016-02-22 ENCOUNTER — Encounter: Payer: Self-pay | Admitting: Nurse Practitioner

## 2016-02-22 ENCOUNTER — Encounter: Payer: Self-pay | Admitting: Cardiovascular Disease

## 2016-02-27 ENCOUNTER — Ambulatory Visit (HOSPITAL_COMMUNITY)
Admission: RE | Admit: 2016-02-27 | Discharge: 2016-02-27 | Disposition: A | Discharge status: Home / Self Care | Payer: BLUE CROSS/BLUE SHIELD | Source: Ambulatory Visit | Attending: Cardiovascular Disease | Admitting: Cardiovascular Disease

## 2016-02-27 ENCOUNTER — Encounter (HOSPITAL_COMMUNITY)
Admission: RE | Disposition: A | Discharge status: Home / Self Care | Payer: Self-pay | Source: Ambulatory Visit | Attending: Cardiovascular Disease

## 2016-02-27 DIAGNOSIS — R946 Abnormal results of thyroid function studies: Secondary | ICD-10-CM | POA: Insufficient documentation

## 2016-02-27 DIAGNOSIS — Z7901 Long term (current) use of anticoagulants: Secondary | ICD-10-CM | POA: Insufficient documentation

## 2016-02-27 DIAGNOSIS — Z8249 Family history of ischemic heart disease and other diseases of the circulatory system: Secondary | ICD-10-CM | POA: Insufficient documentation

## 2016-02-27 DIAGNOSIS — Z96643 Presence of artificial hip joint, bilateral: Secondary | ICD-10-CM | POA: Insufficient documentation

## 2016-02-27 DIAGNOSIS — R9439 Abnormal result of other cardiovascular function study: Secondary | ICD-10-CM | POA: Diagnosis not present

## 2016-02-27 DIAGNOSIS — M1990 Unspecified osteoarthritis, unspecified site: Secondary | ICD-10-CM | POA: Diagnosis not present

## 2016-02-27 DIAGNOSIS — K219 Gastro-esophageal reflux disease without esophagitis: Secondary | ICD-10-CM | POA: Diagnosis not present

## 2016-02-27 DIAGNOSIS — E785 Hyperlipidemia, unspecified: Secondary | ICD-10-CM | POA: Insufficient documentation

## 2016-02-27 DIAGNOSIS — I480 Paroxysmal atrial fibrillation: Secondary | ICD-10-CM | POA: Diagnosis not present

## 2016-02-27 DIAGNOSIS — I2510 Atherosclerotic heart disease of native coronary artery without angina pectoris: Secondary | ICD-10-CM | POA: Diagnosis not present

## 2016-02-27 DIAGNOSIS — R55 Syncope and collapse: Secondary | ICD-10-CM

## 2016-02-27 DIAGNOSIS — R9431 Abnormal electrocardiogram [ECG] [EKG]: Secondary | ICD-10-CM | POA: Insufficient documentation

## 2016-02-27 HISTORY — PX: CARDIAC CATHETERIZATION: SHX172

## 2016-02-27 LAB — BASIC METABOLIC PANEL
Anion gap: 7 (ref 5–15)
BUN: 15 mg/dL (ref 6–20)
CO2: 26 mmol/L (ref 22–32)
Calcium: 9.6 mg/dL (ref 8.9–10.3)
Chloride: 105 mmol/L (ref 101–111)
Creatinine, Ser: 1.06 mg/dL (ref 0.61–1.24)
GFR calc Af Amer: >60 mL/min (ref >60)
GFR calc non Af Amer: >60 mL/min (ref >60)
GLUCOSE: 101 mg/dL — AB (ref 65–99)
POTASSIUM: 4.5 mmol/L (ref 3.5–5.1)
SODIUM: 138 mmol/L (ref 135–145)

## 2016-02-27 LAB — CBC
HEMATOCRIT: 42.9 % (ref 39.0–52.0)
Hemoglobin: 15.2 g/dL (ref 13.0–17.0)
MCH: 32.5 pg (ref 26.0–34.0)
MCHC: 35.4 g/dL (ref 30.0–36.0)
MCV: 91.9 fL (ref 78.0–100.0)
PLATELETS: 193 10*3/uL (ref 150–400)
RBC: 4.67 MIL/uL (ref 4.22–5.81)
RDW: 12.1 % (ref 11.5–15.5)
WBC: 3.9 10*3/uL — AB (ref 4.0–10.5)

## 2016-02-27 LAB — POCT ACTIVATED CLOTTING TIME
ACTIVATED CLOTTING TIME: 252 s
ACTIVATED CLOTTING TIME: 235 s

## 2016-02-27 SURGERY — LEFT HEART CATH AND CORONARY ANGIOGRAPHY
Anesthesia: LOCAL

## 2016-02-27 MED ORDER — SODIUM CHLORIDE 0.9 % WEIGHT BASED INFUSION: 1.0000 mL/kg/h | INTRAVENOUS | Status: DC

## 2016-02-27 MED ORDER — FENTANYL CITRATE (PF) 100 MCG/2ML IJ SOLN
INTRAMUSCULAR | Status: DC | PRN
  Administered 2016-02-27: 50 ug via INTRAVENOUS

## 2016-02-27 MED ORDER — MIDAZOLAM HCL 2 MG/2ML IJ SOLN
INTRAMUSCULAR | Status: DC | PRN
  Administered 2016-02-27: 1 mg via INTRAVENOUS

## 2016-02-27 MED ORDER — MIDAZOLAM HCL 2 MG/2ML IJ SOLN
INTRAMUSCULAR | Status: AC
  Filled 2016-02-27: qty 2

## 2016-02-27 MED ORDER — SODIUM CHLORIDE 0.9% FLUSH: 3.0000 mL | Freq: Two times a day (BID) | INTRAVENOUS | Status: DC

## 2016-02-27 MED ORDER — SODIUM CHLORIDE 0.9 % IV SOLN: 250.0000 mL | INTRAVENOUS | Status: DC | PRN

## 2016-02-27 MED ORDER — SODIUM CHLORIDE 0.9 % WEIGHT BASED INFUSION
3.0000 mL/kg/h | INTRAVENOUS | Status: AC
  Administered 2016-02-27: 3 mL/kg/h via INTRAVENOUS

## 2016-02-27 MED ORDER — HEPARIN SODIUM (PORCINE) 1000 UNIT/ML IJ SOLN
INTRAMUSCULAR | Status: AC
  Filled 2016-02-27: qty 1

## 2016-02-27 MED ORDER — LIDOCAINE HCL (PF) 1 % IJ SOLN
INTRAMUSCULAR | Status: AC
  Filled 2016-02-27: qty 30

## 2016-02-27 MED ORDER — SODIUM CHLORIDE 0.9% FLUSH: 3.0000 mL | INTRAVENOUS | Status: DC | PRN

## 2016-02-27 MED ORDER — HEPARIN (PORCINE) IN NACL 2-0.9 UNIT/ML-% IJ SOLN
INTRAMUSCULAR | Status: AC
  Filled 2016-02-27: qty 1000

## 2016-02-27 MED ORDER — VERAPAMIL HCL 2.5 MG/ML IV SOLN
INTRAVENOUS | Status: AC
  Filled 2016-02-27: qty 2

## 2016-02-27 MED ORDER — HEPARIN (PORCINE) IN NACL 2-0.9 UNIT/ML-% IJ SOLN
INTRAMUSCULAR | Status: DC | PRN
  Administered 2016-02-27: 1000 mL

## 2016-02-27 MED ORDER — IOPAMIDOL (ISOVUE-370) INJECTION 76%
INTRAVENOUS | Status: DC | PRN
  Administered 2016-02-27: 100 mL

## 2016-02-27 MED ORDER — ASPIRIN 81 MG PO CHEW
CHEWABLE_TABLET | ORAL | Status: AC
  Administered 2016-02-27: 81 mg via ORAL
  Filled 2016-02-27: qty 1

## 2016-02-27 MED ORDER — VERAPAMIL HCL 2.5 MG/ML IV SOLN
INTRAVENOUS | Status: DC | PRN
  Administered 2016-02-27: 13:00:00 via INTRA_ARTERIAL

## 2016-02-27 MED ORDER — ASPIRIN 81 MG PO CHEW
81.0000 mg | CHEWABLE_TABLET | ORAL | Status: AC
  Administered 2016-02-27: 81 mg via ORAL

## 2016-02-27 MED ORDER — HEPARIN SODIUM (PORCINE) 1000 UNIT/ML IJ SOLN
INTRAMUSCULAR | Status: DC | PRN
Start: 2016-02-27 — End: 2016-02-27
  Administered 2016-02-27: 2000 [IU] via INTRAVENOUS
  Administered 2016-02-27 (×2): 3500 [IU] via INTRAVENOUS

## 2016-02-27 MED ORDER — IOPAMIDOL (ISOVUE-370) INJECTION 76%
INTRAVENOUS | Status: AC
  Filled 2016-02-27: qty 100

## 2016-02-27 MED ORDER — FENTANYL CITRATE (PF) 100 MCG/2ML IJ SOLN
INTRAMUSCULAR | Status: AC
  Filled 2016-02-27: qty 2

## 2016-02-27 MED ORDER — LIDOCAINE HCL (PF) 1 % IJ SOLN
INTRAMUSCULAR | Status: DC | PRN
  Administered 2016-02-27: 2 mL

## 2016-02-27 MED ORDER — SODIUM CHLORIDE 0.9 % IV SOLN: INTRAVENOUS | Status: DC

## 2016-02-27 MED ORDER — RIVAROXABAN 20 MG PO TABS: 20.0000 mg | ORAL_TABLET | Freq: Every day | ORAL | 6 refills | Status: DC

## 2016-02-27 SURGICAL SUPPLY — 17 items
CATH INFINITI 5 FR JL3.5 (CATHETERS) ×2 IMPLANT
CATH INFINITI 5FR ANG PIGTAIL (CATHETERS) ×2 IMPLANT
CATH INFINITI JR4 5F (CATHETERS) ×2 IMPLANT
CATH OPTICROSS 40MHZ (CATHETERS) ×2 IMPLANT
CATH VISTA GUIDE 6FR XBLAD3.5 (CATHETERS) ×2 IMPLANT
DEVICE RAD COMP TR BAND LRG (VASCULAR PRODUCTS) ×2 IMPLANT
GLIDESHEATH SLEND SS 6F .021 (SHEATH) ×2 IMPLANT
KIT ESSENTIALS PG (KITS) ×2 IMPLANT
KIT HEART LEFT (KITS) ×2 IMPLANT
PACK CARDIAC CATHETERIZATION (CUSTOM PROCEDURE TRAY) ×2 IMPLANT
SLED PULL BACK IVUS (MISCELLANEOUS) ×2 IMPLANT
SYR MEDRAD MARK V 150ML (SYRINGE) ×2 IMPLANT
TRANSDUCER W/STOPCOCK (MISCELLANEOUS) ×2 IMPLANT
TUBING CIL FLEX 10 FLL-RA (TUBING) ×2 IMPLANT
WIRE HI TORQ VERSACORE-J 145CM (WIRE) ×2 IMPLANT
WIRE RUNTHROUGH .014X180CM (WIRE) ×2 IMPLANT
WIRE SAFE-T 1.5MM-J .035X260CM (WIRE) ×2 IMPLANT

## 2016-02-27 NOTE — Discharge Instructions (Signed)
Radial Site Care °Refer to this sheet in the next few weeks. These instructions provide you with information about caring for yourself after your procedure. Your health care provider may also give you more specific instructions. Your treatment has been planned according to current medical practices, but problems sometimes occur. Call your health care provider if you have any problems or questions after your procedure. °WHAT TO EXPECT AFTER THE PROCEDURE °After your procedure, it is typical to have the following: °· Bruising at the radial site that usually fades within 1-2 weeks. °· Blood collecting in the tissue (hematoma) that may be painful to the touch. It should usually decrease in size and tenderness within 1-2 weeks. °HOME CARE INSTRUCTIONS °· Take medicines only as directed by your health care provider. °· You may shower 24-48 hours after the procedure or as directed by your health care provider. Remove the bandage (dressing) and gently wash the site with plain soap and water. Pat the area dry with a clean towel. Do not rub the site, because this may cause bleeding. °· Do not take baths, swim, or use a hot tub until your health care provider approves. °· Check your insertion site every day for redness, swelling, or drainage. °· Do not apply powder or lotion to the site. °· Do not flex or bend the affected arm for 24 hours or as directed by your health care provider. °· Do not push or pull heavy objects with the affected arm for 24 hours or as directed by your health care provider. °· Do not lift over 10 lb (4.5 kg) for 5 days after your procedure or as directed by your health care provider. °· Ask your health care provider when it is okay to: °¨ Return to work or school. °¨ Resume usual physical activities or sports. °¨ Resume sexual activity. °· Do not drive home if you are discharged the same day as the procedure. Have someone else drive you. °· You may drive 24 hours after the procedure unless otherwise  instructed by your health care provider. °· Do not operate machinery or power tools for 24 hours after the procedure. °· If your procedure was done as an outpatient procedure, which means that you went home the same day as your procedure, a responsible adult should be with you for the first 24 hours after you arrive home. °· Keep all follow-up visits as directed by your health care provider. This is important. °SEEK MEDICAL CARE IF: °· You have a fever. °· You have chills. °· You have increased bleeding from the radial site. Hold pressure on the site. °SEEK IMMEDIATE MEDICAL CARE IF: °· You have unusual pain at the radial site. °· You have redness, warmth, or swelling at the radial site. °· You have drainage (other than a small amount of blood on the dressing) from the radial site. °· The radial site is bleeding, and the bleeding does not stop after 30 minutes of holding steady pressure on the site. °· Your arm or hand becomes pale, cool, tingly, or numb. °  °This information is not intended to replace advice given to you by your health care provider. Make sure you discuss any questions you have with your health care provider. °  °Document Released: 05/24/2010 Document Revised: 05/12/2014 Document Reviewed: 11/07/2013 °Elsevier Interactive Patient Education ©2016 [employer] Inc[employer] ° °

## 2016-02-27 NOTE — Interval H&P Note (Signed)
History and Physical Interval Note:  02/27/2016 12:27 PM  Mike Mccann  has presented today for cardiac catheterization, with the diagnosis of abnormal stress test  The various methods of treatment have been discussed with the patient and family. After consideration of risks, benefits and other options for treatment, the patient has consented to  Procedure(s): Left Heart Cath and Coronary Angiography (N/A) as a surgical intervention .  The patient's history has been reviewed, patient examined, no change in status, stable for surgery.  I have reviewed the patient's chart and labs.  Questions were answered to the patient's satisfaction.    Cath Lab Visit (complete for each Cath Lab visit)  Clinical Evaluation Leading to the Procedure:   ACS: No.  Non-ACS:    Anginal Classification: CCS I (unexplained syncope)  Anti-ischemic medical therapy: No Therapy  Non-Invasive Test Results: Intermediate-risk stress test findings: cardiac mortality 1-3%/year  Prior CABG: No previous CABG   Kashmir Leedy

## 2016-02-27 NOTE — H&P (View-Only) (Signed)
Primary Care Physician: Ezequiel Kayser, MD Primary Cardiologist: Dr Elease Hashimoto Primary Electrophysiologist: DR Parker Wherley Referring Physician: Dr Greig Mike Mccann is a 55 y.o. male with a history of recurrent symptomatic palpitations of unclear origin.  He was followed by Dr Alanda Amass and workup was unrevealing.  He swims, rides his bike and plays tennis without difficulty. About a month ago, he developed fatigue and decreased exercise tolerance of unclear significance.  This persisted for about two weeks.  He was hosting a party and at his house when he had abrupt onset of syncope.  He was unconscious for only several seconds.  He presented 01/31/16 and was found to be in afib.  He continued to feel quite fatigued.  He was scheduled for cardioversion 02/05/16 but converted to sinus 10/2 with resolution of symptoms.   Over the past week, he has had several episodes of afib which he has documented on [facility]. He is clearly symptomatic.  He denies any further presyncope or syncope.  Today, he denies symptoms of palpitations, chest pain, shortness of breath, orthopnea, PND, lower extremity edema, dizziness, presyncope, syncope, snoring, daytime somnolence, bleeding, or neurologic sequela. The patient is tolerating medications without difficulties and is otherwise without complaint today.    Atrial Fibrillation Risk Factors:  he does not have symptoms or diagnosis of sleep apnea.  he does not have a history of rheumatic fever.  he does not have a history of alcohol use.  he has a BMI of Body mass index is 22.64 kg/m.[name] Filed Weights   02/13/16 1705  Weight: 164 lb 9.6 oz (74.7 kg)    LA size: pending   Atrial Fibrillation Management history:  Previous antiarrhythmic drugs: none  Previous cardioversions: none  Previous ablations: none  CHADS2VASC score: 0  Anticoagulation history: xarelto currently   Past Medical History:  Diagnosis Date  . Abnormal finding on EKG October  2013    a) 2-D Echo, Mod RA& mild LA dilation, RVSP 33 mmHg. Nl LV function - EF > 55%. No valvular dz;; b) Myoview: ~ mild basal-mid inferoseptal ischemia: A low risk scan with excellent exercise capacity of 15 METS, exercising for 13 min, but horizontal ST depression in Lat leads;; c) repeat Myoview 02/2013: LOW RISK study w/ diaphragmatic attenuation   . Arthritis    hip  . Complication of anesthesia    given more medications than usual-"unsure if that happend"  . Dyslipidemia, goal LDL below 130    On statin, "ratio is really off"  . GERD (gastroesophageal reflux disease)   . Paroxysmal atrial fibrillation (HCC)   . Tachycardia    Past Surgical History:  Procedure Laterality Date  . COLONOSCOPY  2 years ago  . TOTAL HIP ARTHROPLASTY Left 04/04/2014   Procedure: LEFT TOTAL HIP ARTHROPLASTY ANTERIOR APPROACH;  Surgeon: [name], MD;  Location: [facility];  Service: Orthopedics;  Laterality: Left;  . TOTAL HIP ARTHROPLASTY Mike 05/02/2014   Procedure: TOTAL Mike HIP ARTHROPLASTY ANTERIOR APPROACH;  Surgeon: [name], MD;  Location: [facility];  Service: Orthopedics;  Laterality: Mike;  [name] VASECTOMY  8 years ago  . WISDOM TOOTH EXTRACTION  10 years ago    Current Outpatient Prescriptions  Medication Sig Dispense Refill  . Coenzyme Q10 (CO Q 10 PO) Take 1 tablet by mouth daily.    . Multiple Vitamins-Minerals (MULTIVITAMIN ADULT PO) Take 1 tablet by mouth daily.    [name] omeprazole (PRILOSEC OTC) 20 MG tablet Take 20 mg by mouth  as needed.     . rivaroxaban (XARELTO) 20 MG TABS tablet Take 1 tablet (20 mg total) by mouth daily with supper. 30 tablet 6  . simvastatin (ZOCOR) 40 MG tablet Take 20 mg by mouth daily.  2   No current facility-administered medications for this encounter.     No Active Allergies  Social History   Social History  . Marital status: Married    Spouse name: N/A  . Number of children: N/A  . Years of education: N/A   Occupational History  . Not on  file.   Social History Main Topics  . Smoking status: Never Smoker  . Smokeless tobacco: Never Used  . Alcohol use Yes     Comment: wine once a month  . Drug use: No  . Sexual activity: Not on file   Other Topics Concern  . Not on file   Social History Narrative   Married father of 2. Works full-time for [employer] in the [employer] department.   [place] and swims in the outside pool in the summer and at the [employer] during the wintertime. He also rides his bicycle when his hips are not bothering him.  Married to Mike Mccann (ENT) MD    Family History  Problem Relation Age of Onset  . Heart attack Maternal Grandfather   . Heart attack Maternal Uncle    The patient does not have a history of early familial atrial fibrillation or other arrhythmias.  ROS- All systems are reviewed and negative except as per the HPI above.  Physical Exam: Vitals:   02/13/16 1705  BP: 118/82  Weight: 164 lb 9.6 oz (74.7 kg)  Height: 5' 11.5" (1.816 m)     GEN- The patient is well appearing, alert and oriented x 3 today.   Head- normocephalic, atraumatic Eyes-  Sclera clear, conjunctiva pink Ears- hearing intact Oropharynx- clear Neck- supple  Lungs- Clear to ausculation bilaterally, normal work of breathing Heart- Regular rate and rhythm, no murmurs, rubs or gallops  GI- soft, NT, ND, + BS Extremities- no clubbing, cyanosis, or edema MS- no significant deformity or atrophy Skin- no rash or lesion Psych- euthymic mood, full affect Neuro- strength and sensation are intact  Wt Readings from Last 3 Encounters:  02/13/16 164 lb 9.6 oz (74.7 kg)  01/31/16 164 lb 12.8 oz (74.8 kg)  05/05/14 163 lb (73.9 kg)    EKG today demonstrates sinus rhythm 58 bpm, PR 182 msec, QRS 98 msec, QTc 406 msec, rsr', LVH Echo is pending  Epic records are reviewed at length today  Assessment and Plan:  1. paroxysmal atrial fibrillation The patient has Symptomatic  paroxysmal atrial fibrillation.  The patients CHAD2VASC score is 0.  He is presently on xarelto. I have reviewed his [facility] printouts today which clearly show atrial fibrillation.  He is young and not very interested in medical therapy long term.  He also has sinus bradycardia which may limit treatment. Therapeutic strategies for afib including medicine and ablation were discussed in detail with the patient today. Risk, benefits, and alternatives to EP study and radiofrequency ablation for afib were also discussed in detail today.  At this time, he would like to further contemplate his options.  We discussed flecainide with diltiazem and multaq as AAD options.  I worry that with bradycardia, he may not tolerate these medicines long term.  I suspect that ablation may be his best option. He will return to see me  in 3 weeks for further discussion. Echo is ordered  2. Syncope By history, I am suspicious for post termination pause as the cause.  Echo is pending.  I will also obtain an ETT to exclude ischemia/ exercise induced arrhythmias.  No driving x 6 months (Pt aware).  3. Abnormal TSH Check free T4  Return to see me in 3 weeks If ETT is low risk, ok to resume regular exercise   Hillis Range, MD 02/13/2016 5:06 PM

## 2016-02-28 ENCOUNTER — Encounter (HOSPITAL_COMMUNITY): Payer: Self-pay | Admitting: Internal Medicine

## 2016-02-29 ENCOUNTER — Encounter: Payer: Self-pay | Admitting: Cardiovascular Disease

## 2016-02-29 ENCOUNTER — Encounter: Payer: Self-pay | Admitting: Internal Medicine

## 2016-03-02 ENCOUNTER — Encounter: Payer: Self-pay | Admitting: Cardiovascular Disease

## 2016-03-05 ENCOUNTER — Encounter: Payer: Self-pay | Admitting: Cardiovascular Disease

## 2016-03-06 ENCOUNTER — Encounter: Payer: Self-pay | Admitting: Cardiovascular Disease

## 2016-03-06 ENCOUNTER — Ambulatory Visit (INDEPENDENT_AMBULATORY_CARE_PROVIDER_SITE_OTHER): Payer: BLUE CROSS/BLUE SHIELD | Admitting: Cardiovascular Disease

## 2016-03-06 VITALS — BP 110/78 | HR 73 | Ht 72.0 in | Wt 165.1 lb

## 2016-03-06 DIAGNOSIS — I482 Chronic atrial fibrillation, unspecified: Secondary | ICD-10-CM | POA: Insufficient documentation

## 2016-03-06 DIAGNOSIS — I480 Paroxysmal atrial fibrillation: Secondary | ICD-10-CM | POA: Diagnosis not present

## 2016-03-06 MED ORDER — PROPRANOLOL HCL 10 MG PO TABS: 10.0000 mg | ORAL_TABLET | Freq: Four times a day (QID) | ORAL | 6 refills | Status: DC | PRN

## 2016-03-06 MED ORDER — ASPIRIN EC 81 MG PO TBEC: 81.0000 mg | DELAYED_RELEASE_TABLET | Freq: Every day | ORAL | Status: DC

## 2016-03-06 NOTE — Progress Notes (Signed)
Cardiology Office Note   Date:  03/06/2016   ID:  Mike Mccann, Nephew 1960/12/14, MRN [id number]  PCP:  Mike Kayser, MD  Cardiologist:  Dr Mike Mccann 02/2014 Mike Miss, MD   Chief Complaint  Patient presents with  . Follow-up    AFib/post Cath   Problem list 1. Paroxysmal atrial fibrillation ( CHADS2VASC = 0)  2. Hyperlipidemia   Previous notes from Sept. 28, 2017 Mike Mccann) :   Mike Mccann is a 55 y.o. male with a history of CP> echo 2013 w/ mild-mod biatrial dilat, EF 55%, MV 2014 w/ diaphragmatic attn, EF 44%, hx tachycardia (never caught), GERD, HLD.   Mike Mccann presents for evaluation of syncope, palpitations  He has had intermittent palpitations and tachycardia for several years. The episodes were brief and never caught on ECG. He was not terribly symptomatic with them. However recently, he has had longer duration of just not feeling well, and feeling his heart beat out of rhythm at times. He has not had problems with tachycardia.  Starting in the spring, he would have episodes where he did not feel well but then would feel normal for a while. However, over the last couple of months, he has felt a little bit bad all the time. His heart rate has been running higher than usual, in the 60s and 70s. He has been able to exercise without difficulty. He has been waking up with a headache most days of the week. This is treated with Aleve with success.  This weekend, he had not done anything unusual and had not had anything unusual to eat and drink. He had not had a large amount of alcohol. He began feeling bad, he became diaphoretic and lightheaded. He noted on his feet that that his heart rate was 40. He sat down but did not feel much better. He had a syncopal episode. It did not last very long. EMS was called. Prior to EMS arrival, his heart rate was back up to 50 and he felt much better. He was not transported and no ECG is available. He did not document any heart  rates < 40.   He gets occasional nosebleeds in the winter months, and he uses saline nasal spray to try and prevent those. He has no other bleeding issues. He has had no chest pain or shortness of breath. He's had no increased dyspnea on exertion.   03/06/2016: Mike Mccann is seen today for follow-up of his paroxysmal atrial fibrillation. Since I last saw him in the hospital he has had a cardiac cath which revealed normal coronary arteries. He has normal left ventricle systolic function with an estimated ejection fraction of 55-65%. His echocardiogram reveals normal left ventricle systolic function. He has mild diastolic dysfunction. He has trace mitral regurgitation and mild tricuspid regurgitation.   He has been seen in the A. fib clinic on Oct. 11 , 2017.  He has been using the [place] monitor.    Has frequent episodes of PAF in the AM.   Typical episodes last 3 hours He has minimal symptoms if he is at work at his desk If he has to walk around , then he might have some lightheadedness and does not feel very well.     Past Medical History:  Diagnosis Date  . Abnormal finding on EKG October 2013    a) 2-D Echo, Mod RA& mild LA dilation, RVSP 33 mmHg. Nl LV function - EF > 55%. No valvular dz;;  b) Myoview: ~ mild basal-mid inferoseptal ischemia: A low risk scan with excellent exercise capacity of 15 METS, exercising for 13 min, but horizontal ST depression in Lat leads;; c) repeat Myoview 02/2013: LOW RISK study w/ diaphragmatic attenuation   . Arthritis    hip  . Complication of anesthesia    given more medications than usual-"unsure if that happend"  . Dyslipidemia, goal LDL below 130    On statin, "ratio is really off"  . GERD (gastroesophageal reflux disease)   . Paroxysmal atrial fibrillation (HCC)   . Tachycardia     Past Surgical History:  Procedure Laterality Date  . CARDIAC CATHETERIZATION N/A 02/27/2016   Procedure: Left Heart Cath and Coronary Angiography;  Surgeon:  Mike Kendallhristopher End, MD;  [facility];  Service: Cardiovascular;  Laterality: N/A;  . CARDIAC CATHETERIZATION N/A 02/27/2016   Procedure: Intravascular Ultrasound/IVUS;  Surgeon: Mike Kendallhristopher End, MD;  Location: [facility] CV [facility];  Service: Cardiovascular;  Laterality: N/A;  . COLONOSCOPY  2 years ago  . TOTAL HIP ARTHROPLASTY Left 04/04/2014   Procedure: LEFT TOTAL HIP ARTHROPLASTY ANTERIOR APPROACH;  Surgeon: Mike PalMatthew D Olin, MD;  Location: [facility];  Service: Orthopedics;  Laterality: Left;  . TOTAL HIP ARTHROPLASTY Right 05/02/2014   Procedure: TOTAL RIGHT HIP ARTHROPLASTY ANTERIOR APPROACH;  Surgeon: Mike PalMatthew D Olin, MD;  Location: [facility];  Service: Orthopedics;  Laterality: Right;  [name]. VASECTOMY  8 years ago  . WISDOM TOOTH EXTRACTION  10 years ago    Current Outpatient Prescriptions  Medication Sig Dispense Refill  . Coenzyme Q10 (CO Q 10 PO) Take 1 capsule by mouth daily.     . Multiple Vitamins-Minerals (MULTIVITAMIN ADULT PO) Take 1 tablet by mouth daily.    [name]. omeprazole (PRILOSEC OTC) 20 MG tablet Take 20 mg by mouth daily as needed (for acid reflux.).     [name]. oxymetazoline (AFRIN 12 HOUR) 0.05 % nasal spray Place 1 spray into both nostrils 2 (two) times daily as needed for congestion.    . rivaroxaban (XARELTO) 20 MG TABS tablet Take 1 tablet (20 mg total) by mouth daily with supper. 30 tablet 6  . simvastatin (ZOCOR) 40 MG tablet Take 20 mg by mouth daily.  2  . Soft Lens Products (EQ LUBRICATING/REWETTING DROPS) SOLN Place 1 drop into both eyes 3 (three) times daily as needed (for eye irritation.).     No current facility-administered medications for this visit.     Allergies:   Review of patient's allergies indicates no known allergies.    Social History:  The patient  reports that he has never smoked. He has never used smokeless tobacco. He reports that he drinks alcohol. He reports that he does not use drugs.   Family History:  The patient's family history includes  Heart attack in his maternal grandfather and maternal uncle.    ROS:  Please see the history of present illness. All other systems are reviewed and negative.    PHYSICAL EXAM: VS:  BP 110/78   Pulse 73   Ht 6' (1.829 m)   Wt 165 lb 1.9 oz (74.9 kg)   BMI 22.39 kg/m  , BMI Body mass index is 22.39 kg/m. GEN: Well nourished, well developed, male in no acute distress  HEENT: normal for age  Neck: no JVD, no carotid bruit, no masses Cardiac: Irreg R&R; no murmur, no rubs, or gallops Respiratory:  clear to auscultation bilaterally, normal work of breathing GI: soft, nontender, nondistended, + BS MS: no  deformity or atrophy; no edema; distal pulses are 2+ in all 4 extremities   Skin: warm and dry, no rash Neuro:  Strength and sensation are intact Psych: euthymic mood, full affect   EKG:  EKG is ordered today. The ekg ordered today demonstrates atrial fibrillation, heart rate 70, incomplete right bundle The only difference is the rhythm change, previous ECG was sinus  Recent Labs: 01/31/2016: TSH 4.73 02/27/2016: BUN 15; Creatinine, Ser 1.06; Hemoglobin 15.2; Platelets 193; Potassium 4.5; Sodium 138    Lipid Panel No results found for: CHOL, TRIG, HDL, CHOLHDL, VLDL, LDLCALC, LDLDIRECT   Wt Readings from Last 3 Encounters:  03/06/16 165 lb 1.9 oz (74.9 kg)  02/27/16 165 lb (74.8 kg)  02/13/16 164 lb 9.6 oz (74.7 kg)     Other studies Reviewed: Additional studies/ records that were reviewed today include: Office notes and testing.  ASSESSMENT AND PLAN:  1.  Atrial fibrillation:  I have personally reviewed his home [name] strips over the past several weeks . Has PAF  Will try Propranolol 10 QID PRN  CHADS2VSC is 0   -will DC xarelto and try ASA 81 Will see [name] next week.  Had a long discussion about long term  consequences of paroxysmal A. fib ablation. We discussed the long-term effects of medications and also the risks and benefits regarding A. fib  ablation.  At this point I think we should have him monitor and see how he does for the next several months. I have advised him to resume his exercise. We'll give him propranolol to take to see if this helps suppress the episodes of atrial fibrillation. At this point he is minimally symptomatic from his atrial fibrillation. His rate is very well-controlled. The home monitor  [facility]( [place] )  recorded a heart rate of 84 while he is in atrial fibrillation.  2. Anticoagulation: CHA2DS2VASc=0, he is less than 55 years old, he has no history of diabetes, hypertension, CHF, CAD or CVA, and is not male.     Mike MissPhilip Nahser, MD  03/06/2016 9:49 AM    [facility] [address],  [address] [place], [place]  [address] Pager [phone] Phone: [phone]; Fax: [phone]

## 2016-03-06 NOTE — Patient Instructions (Signed)
Medication Instructions:  STOP Xarelto START Aspirin 81 mg once daily TAKE Propranolol (Inderal) 10 mg up to 4 times per day as needed for palpitations/fast heart rate   Labwork: None Ordered   Testing/Procedures: None Ordered   Follow-Up: Your physician recommends that you schedule a follow-up appointment in: 3 months with Dr. Elease HashimotoNahser   If you need a refill on your cardiac medications before your next appointment, please call your pharmacy.   Thank you for choosing [facility]! Eligha BridegroomMichelle Swinyer, RN [phone]

## 2016-03-10 ENCOUNTER — Ambulatory Visit (INDEPENDENT_AMBULATORY_CARE_PROVIDER_SITE_OTHER): Payer: BLUE CROSS/BLUE SHIELD | Admitting: Internal Medicine

## 2016-03-10 ENCOUNTER — Encounter: Payer: Self-pay | Admitting: Internal Medicine

## 2016-03-10 VITALS — BP 118/74 | HR 51 | Ht 72.0 in | Wt 163.2 lb

## 2016-03-10 DIAGNOSIS — I480 Paroxysmal atrial fibrillation: Secondary | ICD-10-CM | POA: Diagnosis not present

## 2016-03-10 NOTE — Patient Instructions (Signed)
Medication Instructions:  Your physician recommends that you continue on your current medications as directed. Please refer to the Current Medication list given to you today.  Labwork: None ordered.  Testing/Procedures: None ordered.  Follow-Up: Your physician recommends that you schedule a follow-up appointment as needed.   Any Other Special Instructions Will Be Listed Below (If Applicable).     If you need a refill on your cardiac medications before your next appointment, please call your pharmacy.   

## 2016-03-12 NOTE — Progress Notes (Signed)
Primary Care Physician: Ezequiel KayserPERINI,MARK A, MD Primary Cardiologist: Dr Elease HashimotoNahser Primary Electrophysiologist: DR Brylan Seubert Referring Physician: Dr Greig RightNahser   Sharron Bard Herbert Graef is a 55 y.o. male with recently diagnosed atrial fibrillation.  He has afib about once per week, lasting several hours.  He has modified his lifestyle but continues to have episodes.  Recent cath revealed no CAD. Today, he denies symptoms of palpitations, chest pain, shortness of breath, orthopnea, PND, lower extremity edema, dizziness, presyncope, syncope, snoring, daytime somnolence, bleeding, or neurologic sequela. The patient is tolerating medications without difficulties and is otherwise without complaint today.    Atrial Fibrillation Risk Factors:  he does not have symptoms or diagnosis of sleep apnea.  he does not have a history of rheumatic fever.  he does not have a history of alcohol use.  he has a BMI of Body mass index is 22.13 kg/m.[name]. Filed Weights   03/10/16 1233  Weight: 163 lb 3.2 oz (74 kg)    LA size: pending   Atrial Fibrillation Management history:  Previous antiarrhythmic drugs: none  Previous cardioversions: none  Previous ablations: none  CHADS2VASC score: 0  Anticoagulation history: xarelto currently   Past Medical History:  Diagnosis Date  . Abnormal finding on EKG October 2013    a) 2-D Echo, Mod RA& mild LA dilation, RVSP 33 mmHg. Nl LV function - EF > 55%. No valvular dz;; b) Myoview: ~ mild basal-mid inferoseptal ischemia: A low risk scan with excellent exercise capacity of 15 METS, exercising for 13 min, but horizontal ST depression in Lat leads;; c) repeat Myoview 02/2013: LOW RISK study w/ diaphragmatic attenuation   . Arthritis    hip  . Complication of anesthesia    given more medications than usual-"unsure if that happend"  . Dyslipidemia, goal LDL below 130    On statin, "ratio is really off"  . GERD (gastroesophageal reflux disease)   . Paroxysmal atrial fibrillation  (HCC)   . Tachycardia    Past Surgical History:  Procedure Laterality Date  . CARDIAC CATHETERIZATION N/A 02/27/2016   Procedure: Left Heart Cath and Coronary Angiography;  Surgeon: Yvonne Kendallhristopher End, MD;  [facility];  Service: Cardiovascular;  Laterality: N/A;  . CARDIAC CATHETERIZATION N/A 02/27/2016   Procedure: Intravascular Ultrasound/IVUS;  Surgeon: Yvonne Kendallhristopher End, MD;  Location: [facility];  Service: Cardiovascular;  Laterality: N/A;  . COLONOSCOPY  2 years ago  . TOTAL HIP ARTHROPLASTY Left 04/04/2014   Procedure: LEFT TOTAL HIP ARTHROPLASTY ANTERIOR APPROACH;  Surgeon: Shelda PalMatthew D Olin, MD;  Location: [facility];  Service: Orthopedics;  Laterality: Left;  . TOTAL HIP ARTHROPLASTY Right 05/02/2014   Procedure: TOTAL RIGHT HIP ARTHROPLASTY ANTERIOR APPROACH;  Surgeon: Shelda PalMatthew D Olin, MD;  Location: [facility];  Service: Orthopedics;  Laterality: Right;  [name]. VASECTOMY  8 years ago  . WISDOM TOOTH EXTRACTION  10 years ago    Current Outpatient Prescriptions  Medication Sig Dispense Refill  . aspirin EC 81 MG tablet Take 1 tablet (81 mg total) by mouth daily.    . Coenzyme Q10 (CO Q 10 PO) Take 1 capsule by mouth daily.     . Multiple Vitamins-Minerals (MULTIVITAMIN ADULT PO) Take 1 tablet by mouth daily.    [name]. omeprazole (PRILOSEC OTC) 20 MG tablet Take 20 mg by mouth daily as needed (for acid reflux.).     [name]. oxymetazoline (AFRIN 12 HOUR) 0.05 % nasal spray Place 1 spray into both nostrils 2 (two) times daily as needed for congestion.    .[name]  propranolol (INDERAL) 10 MG tablet Take 10 mg by mouth 4 (four) times daily as needed (heartrate control).    . simvastatin (ZOCOR) 40 MG tablet Take 40 mg by mouth daily.   2  . Soft Lens Products (EQ LUBRICATING/REWETTING DROPS) SOLN Place 1 drop into both eyes 3 (three) times daily as needed (for eye irritation.).     No current facility-administered medications for this visit.     No Known Allergies  Social History   Social  History  . Marital status: Married    Spouse name: N/A  . Number of children: N/A  . Years of education: N/A   Occupational History  . Not on file.   Social History Main Topics  . Smoking status: Never Smoker  . Smokeless tobacco: Never Used  . Alcohol use Yes     Comment: wine once a month  . Drug use: No  . Sexual activity: Not on file   Other Topics Concern  . Not on file   Social History Narrative   Married father of 2. Works full-time for [employer] in the [employer] department.   [place] and swims in the outside pool in the summer and at the [facility] during the wintertime. He also rides his bicycle when his hips are not bothering him.  Married to Lucky CowboySera Livingston (ENT) MD    Family History  Problem Relation Age of Onset  . Heart attack Maternal Grandfather   . Heart attack Maternal Uncle    The patient does not have a history of early familial atrial fibrillation or other arrhythmias.  ROS- All systems are reviewed and negative except as per the HPI above.  Physical Exam: Vitals:   03/10/16 1233  BP: 118/74  Pulse: (!) 51  Weight: 163 lb 3.2 oz (74 kg)  Height: 6' (1.829 m)     GEN- The patient is well appearing, alert and oriented x 3 today.   Head- normocephalic, atraumatic Eyes-  Sclera clear, conjunctiva pink Ears- hearing intact Oropharynx- clear Neck- supple  Lungs- Clear to ausculation bilaterally, normal work of breathing Heart- Regular rate and rhythm, no murmurs, rubs or gallops  GI- soft, NT, ND, + BS Extremities- no clubbing, cyanosis, or edema MS- no significant deformity or atrophy Skin- no rash or lesion Psych- euthymic mood, full affect Neuro- strength and sensation are intact  Wt Readings from Last 3 Encounters:  03/10/16 163 lb 3.2 oz (74 kg)  03/06/16 165 lb 1.9 oz (74.9 kg)  02/27/16 165 lb (74.8 kg)    EKG today demonstrates sinus rhythm 51 bpm, PR 192 msec, QRS 102 msec, QTc 396 msec, rsr',  LVH Echo is reviewed Cath reviewed    Assessment and Plan:  1. paroxysmal atrial fibrillation Doing well currently with ongoing afib I have advised ablation.  At this time, he wishes to continue prn beta blocker therapy.  He has scheduled follow-up with Dr Elease HashimotoNahser.  2. Syncope By history, I am suspicious for post termination pause as the cause.  Will follow clinically  3. Abnormal TSH T4 was normal Follow-up with PCP  Follow-up with Dr Elease HashimotoNahser as scheduled I am happy to see whenever he is ready to consider AAD therapy or ablation.   Hillis RangeJames Chamia Schmutz, MD

## 2016-04-09 DIAGNOSIS — Z125 Encounter for screening for malignant neoplasm of prostate: Secondary | ICD-10-CM | POA: Diagnosis not present

## 2016-04-09 DIAGNOSIS — Z0000 Encounter for general adult medical examination without abnormal findings: Secondary | ICD-10-CM | POA: Diagnosis not present

## 2016-04-16 DIAGNOSIS — R9431 Abnormal electrocardiogram [ECG] [EKG]: Secondary | ICD-10-CM | POA: Diagnosis not present

## 2016-04-16 DIAGNOSIS — D7589 Other specified diseases of blood and blood-forming organs: Secondary | ICD-10-CM | POA: Diagnosis not present

## 2016-04-16 DIAGNOSIS — Z1389 Encounter for screening for other disorder: Secondary | ICD-10-CM | POA: Diagnosis not present

## 2016-04-16 DIAGNOSIS — Z23 Encounter for immunization: Secondary | ICD-10-CM | POA: Diagnosis not present

## 2016-04-16 DIAGNOSIS — R002 Palpitations: Secondary | ICD-10-CM | POA: Diagnosis not present

## 2016-04-16 DIAGNOSIS — Z0000 Encounter for general adult medical examination without abnormal findings: Secondary | ICD-10-CM | POA: Diagnosis not present

## 2016-04-16 DIAGNOSIS — I480 Paroxysmal atrial fibrillation: Secondary | ICD-10-CM | POA: Diagnosis not present

## 2016-04-22 DIAGNOSIS — Z1212 Encounter for screening for malignant neoplasm of rectum: Secondary | ICD-10-CM | POA: Diagnosis not present

## 2016-06-06 ENCOUNTER — Encounter: Payer: Self-pay | Admitting: Cardiovascular Disease

## 2016-06-06 ENCOUNTER — Ambulatory Visit (INDEPENDENT_AMBULATORY_CARE_PROVIDER_SITE_OTHER): Payer: BLUE CROSS/BLUE SHIELD | Admitting: Cardiovascular Disease

## 2016-06-06 DIAGNOSIS — I480 Paroxysmal atrial fibrillation: Secondary | ICD-10-CM | POA: Diagnosis not present

## 2016-06-06 NOTE — Patient Instructions (Signed)
Medication Instructions:  Your physician recommends that you continue on your current medications as directed. Please refer to the Current Medication list given to you today.   Labwork: None Ordered   Testing/Procedures: None Ordered   Follow-Up: Your physician wants you to follow-up in: 6 months with Dr. Nahser.  You will receive a reminder letter in the mail two months in advance. If you don't receive a letter, please call our office to schedule the follow-up appointment.   If you need a refill on your cardiac medications before your next appointment, please call your pharmacy.   Thank you for choosing [facility]! Fumio Vandam, RN [phone]    

## 2016-06-06 NOTE — Progress Notes (Signed)
Cardiology Office Note   Date:  06/06/2016   ID:  Mike Mccann, DOB 12/29/1960, MRN [id number]  PCP:  Mike KayserPERINI,MARK A, MD  Cardiologist:   Mike MissPhilip Nahser, MD   Chief Complaint  Patient presents with  . Follow-up    Paroxysmal Atrial Fib    Problem list 1. Paroxysmal atrial fibrillation ( CHADS2VASC = 0)  2. Hyperlipidemia   Previous notes from Sept. 28, 2017 Mike Mccann( Mike Mccann) :   Mike Mccann is Mccann 56 y.o. male with Mccann history of CP> echo 2013 w/ mild-mod biatrial dilat, EF 55%, MV 2014 w/ diaphragmatic attn, EF 44%, hx tachycardia (never caught), GERD, HLD.   Mike Mccann presents for evaluation of syncope, palpitations  He has had intermittent palpitations and tachycardia for several years. The episodes were brief and never caught on ECG. He was not terribly symptomatic with them. However recently, he has had longer duration of just not feeling well, and feeling his heart beat out of rhythm at times. He has not had problems with tachycardia.  Starting in the spring, he would have episodes where he did not feel well but then would feel normal for Mccann while. However, over the last couple of months, he has felt Mccann little bit bad all the time. His heart rate has been running higher than usual, in the 60s and 70s. He has been able to exercise without difficulty. He has been waking up with Mccann headache most days of the week. This is treated with Aleve with success.  This weekend, he had not done anything unusual and had not had anything unusual to eat and drink. He had not had Mccann large amount of alcohol. He began feeling bad, he became diaphoretic and lightheaded. He noted on his feet that that his heart rate was 40. He sat down but did not feel much better. He had Mccann syncopal episode. It did not last very long. EMS was called. Prior to EMS arrival, his heart rate was back up to 50 and he felt much better. He was not transported and no ECG is available. He did not document any heart rates < 40.    He gets occasional nosebleeds in the winter months, and he uses saline nasal spray to try and prevent those. He has no other bleeding issues. He has had no chest pain or shortness of breath. He's had no increased dyspnea on exertion.   03/06/2016: Mike Mccann is seen today for follow-up of his paroxysmal atrial fibrillation. Since I last saw him in the hospital he has had Mccann cardiac cath which revealed normal coronary arteries. He has normal left ventricle systolic function with an estimated ejection fraction of 55-65%. His echocardiogram reveals normal left ventricle systolic function. He has mild diastolic dysfunction. He has trace mitral regurgitation and mild tricuspid regurgitation.   He has been seen in the Mccann. fib clinic on Oct. 11 , 2017.  He has been using the [place] monitor.    Has frequent episodes of PAF in the AM.   Typical episodes last 3 hours He has minimal symptoms if he is at work at his desk If he has to walk around , then he might have some lightheadedness and does not feel very well.   Feb. 2, 2018:  Mike Mccann is seen back for follow up of his atrial fib. Saw Dr. Johney Mccann in Nov.    Mike Mccann does not want to start any arrhythmic therapy   CHADS2VASC is 0.   Takes  Propranolol  Has had several episodes of PAF.   Doesn't make him feel poorly but he can feel like his HR is Irreg.   Has started exercising recently  - swimming again recently    Past Medical History:  Diagnosis Date  . Abnormal finding on EKG October 2013    Mccann) 2-D Echo, Mod RA& mild LA dilation, RVSP 33 mmHg. Nl LV function - EF > 55%. No valvular dz;; b) Myoview: ~ mild basal-mid inferoseptal ischemia: Mccann low risk scan with excellent exercise capacity of 15 METS, exercising for 13 min, but horizontal ST depression in Lat leads;; c) repeat Myoview 02/2013: LOW RISK study w/ diaphragmatic attenuation   . Arthritis    hip  . Complication of anesthesia    given more medications than usual-"unsure if that happend"  .  Dyslipidemia, goal LDL below 130    On statin, "ratio is really off"  . GERD (gastroesophageal reflux disease)   . Paroxysmal atrial fibrillation (HCC)   . Tachycardia     Past Surgical History:  Procedure Laterality Date  . CARDIAC CATHETERIZATION N/Mccann 02/27/2016   Procedure: Left Heart Cath and Coronary Angiography;  Surgeon: [name], MD;  Location: [facility] INVASIVE CV LAB;  Service: Cardiovascular;  Laterality: N/Mccann;  . CARDIAC CATHETERIZATION N/Mccann 02/27/2016   Procedure: Intravascular Ultrasound/IVUS;  Surgeon: [name], MD;  Location: [facility] INVASIVE CV LAB;  Service: Cardiovascular;  Laterality: N/Mccann;  . COLONOSCOPY  2 years ago  . TOTAL HIP ARTHROPLASTY Left 04/04/2014   Procedure: LEFT TOTAL HIP ARTHROPLASTY ANTERIOR APPROACH;  Surgeon: [name], MD;  Location: [facility];  Service: Orthopedics;  Laterality: Left;  . TOTAL HIP ARTHROPLASTY Right 05/02/2014   Procedure: TOTAL RIGHT HIP ARTHROPLASTY ANTERIOR APPROACH;  Surgeon: [name], MD;  Location: [facility];  Service: Orthopedics;  Laterality: Right;  [name] VASECTOMY  8 years ago  . WISDOM TOOTH EXTRACTION  10 years ago    Current Outpatient Prescriptions  Medication Sig Dispense Refill  . aspirin EC 81 MG tablet Take 1 tablet (81 mg total) by mouth daily.    . Cyanocobalamin (VITAMIN B-12 PO) Take 1 tablet by mouth daily.    [name] MAGNESIUM PO Take 1 tablet by mouth daily.    . Multiple Vitamins-Minerals (MULTIVITAMIN ADULT PO) Take 1 tablet by mouth daily.    [name] omeprazole (PRILOSEC OTC) 20 MG tablet Take 20 mg by mouth daily as needed (for acid reflux.).     [name] oxymetazoline (AFRIN 12 HOUR) 0.05 % nasal spray Place 1 spray into both nostrils 2 (two) times daily as needed for congestion.    . propranolol (INDERAL) 10 MG tablet Take 10 mg by mouth 4 (four) times daily as needed (heartrate control).    . simvastatin (ZOCOR) 40 MG tablet Take 40 mg by mouth daily.   2  . Soft Lens Products (EQ LUBRICATING/REWETTING DROPS) SOLN  Place 1 drop into both eyes 3 (three) times daily as needed (for eye irritation.).     No current facility-administered medications for this visit.     Allergies:   Patient has no known allergies.    Social History:  The patient  reports that he has never smoked. He has never used smokeless tobacco. He reports that he drinks alcohol. He reports that he does not use drugs.   Family History:  The patient's family history includes Heart attack in his maternal grandfather and maternal uncle.    ROS:  Please see the history of present  illness. All other systems are reviewed and negative.    PHYSICAL EXAM: VS:  BP 100/80   Pulse 80   Ht 6' (1.829 m)   Wt 174 lb 1.9 oz (79 kg)   SpO2 97%   BMI 23.61 kg/m  , BMI Body mass index is 23.61 kg/m. GEN: Well nourished, well developed, male in no acute distress  HEENT: normal for age  Neck: no JVD, no carotid bruit, no masses Cardiac: Irreg Irreg; no murmur, no rubs, or gallops Respiratory:  clear to auscultation bilaterally, normal work of breathing GI: soft, nontender, nondistended, + BS MS: no deformity or atrophy; no edema; distal pulses are 2+ in all 4 extremities   Skin: warm and dry, no rash Neuro:  Strength and sensation are intact Psych: euthymic mood, full affect   EKG:  EKG is ordered today. The ekg ordered today demonstrates atrial fibrillation, heart rate 70, incomplete right bundle The only difference is the rhythm change, previous ECG was sinus  Recent Labs: 01/31/2016: TSH 4.73 02/27/2016: BUN 15; Creatinine, Ser 1.06; Hemoglobin 15.2; Platelets 193; Potassium 4.5; Sodium 138    Lipid Panel No results found for: CHOL, TRIG, HDL, CHOLHDL, VLDL, LDLCALC, LDLDIRECT   Wt Readings from Last 3 Encounters:  06/06/16 174 lb 1.9 oz (79 kg)  03/10/16 163 lb 3.2 oz (74 kg)  03/06/16 165 lb 1.9 oz (74.9 kg)     Other studies Reviewed: Additional studies/ records that were reviewed today include: Office notes and  testing.  ASSESSMENT AND PLAN:  1.  Atrial fibrillation:  I have personally reviewed his home [name] strips over the past several weeks . Has PAF  Will try Propranolol 10 QID PRN  CHADS2VSC is 0   -will DC xarelto and try ASA 81 Will see [name] next week.  Had Mccann long discussion about long term  consequences of paroxysmal Mccann. fib ablation. We discussed the long-term effects of medications and also the risks and benefits regarding Mccann. fib ablation.  He is back in atrial fib this am. Feels fine.  Can only telll that he is in atrial fib by taking Mccann [name] reading . Discussed the strategy if he were to stay in atrial fib for > 24 hours. I advised him to start Xarelto if he remains in AF for > 24 hours and to continue taking the propranolol.    If he converts on his own, he can stop the Xarelto.   If we end up doing Mccann cardioversion then I've recommended that he continue the Xarelto for another 3-4 weeks.  2. Anticoagulation: CHA2DS2VASc=0, he is less than 91 years old, he has no history of diabetes, hypertension, CHF, CAD or CVA, and is not male.     Mike Miss, MD  06/06/2016 8:19 AM    [facility] [address] [place],  [address] [place], [place]  [address] Pager [phone] Phone: [phone]; Fax: [phone]

## 2016-06-17 DIAGNOSIS — H43393 Other vitreous opacities, bilateral: Secondary | ICD-10-CM | POA: Diagnosis not present

## 2016-06-17 DIAGNOSIS — H40011 Open angle with borderline findings, low risk, right eye: Secondary | ICD-10-CM | POA: Diagnosis not present

## 2016-06-17 DIAGNOSIS — H2513 Age-related nuclear cataract, bilateral: Secondary | ICD-10-CM | POA: Diagnosis not present

## 2016-06-17 DIAGNOSIS — H25013 Cortical age-related cataract, bilateral: Secondary | ICD-10-CM | POA: Diagnosis not present

## 2016-06-17 DIAGNOSIS — H40012 Open angle with borderline findings, low risk, left eye: Secondary | ICD-10-CM | POA: Diagnosis not present

## 2016-06-17 DIAGNOSIS — H40013 Open angle with borderline findings, low risk, bilateral: Secondary | ICD-10-CM | POA: Diagnosis not present

## 2016-09-05 DIAGNOSIS — M79662 Pain in left lower leg: Secondary | ICD-10-CM | POA: Diagnosis not present

## 2016-09-25 DIAGNOSIS — M79662 Pain in left lower leg: Secondary | ICD-10-CM | POA: Diagnosis not present

## 2016-12-11 DIAGNOSIS — S76212A Strain of adductor muscle, fascia and tendon of left thigh, initial encounter: Secondary | ICD-10-CM | POA: Diagnosis not present

## 2016-12-23 DIAGNOSIS — M62838 Other muscle spasm: Secondary | ICD-10-CM | POA: Diagnosis not present

## 2017-01-13 ENCOUNTER — Encounter: Payer: Self-pay | Admitting: Cardiovascular Disease

## 2017-01-13 ENCOUNTER — Ambulatory Visit (INDEPENDENT_AMBULATORY_CARE_PROVIDER_SITE_OTHER): Payer: BLUE CROSS/BLUE SHIELD | Admitting: Cardiovascular Disease

## 2017-01-13 VITALS — BP 138/70 | HR 58 | Ht 72.0 in | Wt 169.0 lb

## 2017-01-13 DIAGNOSIS — N529 Male erectile dysfunction, unspecified: Secondary | ICD-10-CM

## 2017-01-13 DIAGNOSIS — I480 Paroxysmal atrial fibrillation: Secondary | ICD-10-CM | POA: Diagnosis not present

## 2017-01-13 MED ORDER — SILDENAFIL CITRATE 20 MG PO TABS: 100.0000 mg | ORAL_TABLET | Freq: Every day | ORAL | 6 refills | Status: DC | PRN

## 2017-01-13 NOTE — Progress Notes (Signed)
Cardiology Office Note   Date:  01/13/2017   ID:  Mike Mccann, DOB 12/11/1960, MRN [id number]  PCP:  Rodrigo RanPerini, Mark, MD  Cardiologist:   Kristeen MissPhilip Yee Joss, MD   Chief Complaint  Patient presents with  . Follow-up    atrial fibrillation   Problem list 1. Paroxysmal atrial fibrillation ( CHADS2VASC = 0)  2. Hyperlipidemia   Previous notes from Sept. 28, 2017 Mike Mccann( Mike Mccann) :   [name] is a 56 y.o. male with a history of CP> echo 2013 w/ mild-mod biatrial dilat, EF 55%, MV 2014 w/ diaphragmatic attn, EF 44%, hx tachycardia (never caught), GERD, HLD.   Hoover BrownsJames P Platte presents for evaluation of syncope, palpitations  He has had intermittent palpitations and tachycardia for several years. The episodes were brief and never caught on ECG. He was not terribly symptomatic with them. However recently, he has had longer duration of just not feeling well, and feeling his heart beat out of rhythm at times. He has not had problems with tachycardia.  Starting in the spring, he would have episodes where he did not feel well but then would feel normal for a while. However, over the last couple of months, he has felt a little bit bad all the time. His heart rate has been running higher than usual, in the 60s and 70s. He has been able to exercise without difficulty. He has been waking up with a headache most days of the week. This is treated with Aleve with success.  This weekend, he had not done anything unusual and had not had anything unusual to eat and drink. He had not had a large amount of alcohol. He began feeling bad, he became diaphoretic and lightheaded. He noted on his feet that that his heart rate was 40. He sat down but did not feel much better. He had a syncopal episode. It did not last very long. EMS was called. Prior to EMS arrival, his heart rate was back up to 50 and he felt much better. He was not transported and no ECG is available. He did not document any heart rates < 40.   He  gets occasional nosebleeds in the winter months, and he uses saline nasal spray to try and prevent those. He has no other bleeding issues. He has had no chest pain or shortness of breath. He's had no increased dyspnea on exertion.   03/06/2016: Mike Mccann is seen today for follow-up of his paroxysmal atrial fibrillation. Since I last saw him in the hospital he has had a cardiac cath which revealed normal coronary arteries. He has normal left ventricle systolic function with an estimated ejection fraction of 55-65%. His echocardiogram reveals normal left ventricle systolic function. He has mild diastolic dysfunction. He has trace mitral regurgitation and mild tricuspid regurgitation.   He has been seen in the A. fib clinic on Oct. 11 , 2017.  He has been using the [place] monitor.    Has frequent episodes of PAF in the AM.   Typical episodes last 3 hours He has minimal symptoms if he is at work at his desk If he has to walk around , then he might have some lightheadedness and does not feel very well.   Feb. 2, 2018:  Mike Mccann is seen back for follow up of his atrial fib. Saw Dr. Johney FrameAllred in Nov.    Mike Mccann does not want to start any arrhythmic therapy   CHADS2VASC is 0.   Takes Propranolol  Has had several episodes of PAF.   Doesn't make him feel poorly but he can feel like his HR is Irreg.   Has started exercising recently  - swimming again recently    Sept. 11, 2018:  Mike Mcalpine is seen today for follow up . Is doing great. Has some occasional palpitations.   Has not had any prolonged episodes of Afib.  Exercising regularly .   Is exercising at a moderate pace.   Past Medical History:  Diagnosis Date  . Abnormal finding on EKG October 2013    a) 2-D Echo, Mod RA& mild LA dilation, RVSP 33 mmHg. Nl LV function - EF > 55%. No valvular dz;; b) Myoview: ~ mild basal-mid inferoseptal ischemia: A low risk scan with excellent exercise capacity of 15 METS, exercising for 13 min, but horizontal ST depression in  Lat leads;; c) repeat Myoview 02/2013: LOW RISK study w/ diaphragmatic attenuation   . Arthritis    hip  . Complication of anesthesia    given more medications than usual-"unsure if that happend"  . Dyslipidemia, goal LDL below 130    On statin, "ratio is really off"  . GERD (gastroesophageal reflux disease)   . Paroxysmal atrial fibrillation (HCC)   . Tachycardia     Past Surgical History:  Procedure Laterality Date  . CARDIAC CATHETERIZATION N/A 02/27/2016   Procedure: Left Heart Cath and Coronary Angiography;  Surgeon: [name], MD;  Location: [facility] INVASIVE CV LAB;  Service: Cardiovascular;  Laterality: N/A;  . CARDIAC CATHETERIZATION N/A 02/27/2016   Procedure: Intravascular Ultrasound/IVUS;  Surgeon: [name], MD;  Location: [facility] INVASIVE CV LAB;  Service: Cardiovascular;  Laterality: N/A;  . COLONOSCOPY  2 years ago  . TOTAL HIP ARTHROPLASTY Left 04/04/2014   Procedure: LEFT TOTAL HIP ARTHROPLASTY ANTERIOR APPROACH;  Surgeon: [name], MD;  Location: [facility];  Service: Orthopedics;  Laterality: Left;  . TOTAL HIP ARTHROPLASTY Right 05/02/2014   Procedure: TOTAL RIGHT HIP ARTHROPLASTY ANTERIOR APPROACH;  Surgeon: [name], MD;  Location: [facility];  Service: Orthopedics;  Laterality: Right;  Mike Mccann VASECTOMY  8 years ago  . WISDOM TOOTH EXTRACTION  10 years ago    Current Outpatient Prescriptions  Medication Sig Dispense Refill  . aspirin EC 81 MG tablet Take 1 tablet (81 mg total) by mouth daily.    . Cyanocobalamin (VITAMIN B-12 PO) Take 1 tablet by mouth daily.    Mike Mccann MAGNESIUM PO Take 1 tablet by mouth daily.    . Multiple Vitamins-Minerals (MULTIVITAMIN ADULT PO) Take 1 tablet by mouth daily.    Mike Mccann omeprazole (PRILOSEC OTC) 20 MG tablet Take 20 mg by mouth daily as needed (for acid reflux.).     Mike Mccann oxymetazoline (AFRIN 12 HOUR) 0.05 % nasal spray Place 1 spray into both nostrils 2 (two) times daily as needed for congestion.    . propranolol (INDERAL) 10 MG tablet  Take 10 mg by mouth 4 (four) times daily as needed (heartrate control).    . simvastatin (ZOCOR) 40 MG tablet Take 40 mg by mouth daily.   2  . Soft Lens Products (EQ LUBRICATING/REWETTING DROPS) SOLN Place 1 drop into both eyes 3 (three) times daily as needed (for eye irritation.).     No current facility-administered medications for this visit.     Allergies:   Patient has no known allergies.    Social History:  The patient  reports that he has never smoked. He has never used smokeless tobacco. He reports that  he drinks alcohol. He reports that he does not use drugs.   Family History:  The patient's family history includes Heart attack in his maternal grandfather and maternal uncle.    ROS:  Please see the history of present illness. All other systems are reviewed and negative.    PHYSICAL EXAM: VS:  BP 138/70   Pulse (!) 58   Ht 6' (1.829 m)   Wt 169 lb (76.7 kg)   BMI 22.92 kg/m  , BMI Body mass index is 22.92 kg/m. GEN: Well nourished, well developed, male in no acute distress  HEENT: normal for age  Neck: no JVD, no carotid bruit, no masses Cardiac: Irreg Irreg; no murmur, no rubs, or gallops Respiratory:  clear to auscultation bilaterally, normal work of breathing GI: soft, nontender, nondistended, + BS MS: no deformity or atrophy; no edema; distal pulses are 2+ in all 4 extremities   Skin: warm and dry, no rash Neuro:  Strength and sensation are intact Psych: euthymic mood, full affect   EKG:  EKG is ordered today. Sept. 11, 2018:  Sinus brady at 67.   Moderate voltate criteria for LVH .   Recent Labs: 01/31/2016: TSH 4.73 02/27/2016: BUN 15; Creatinine, Ser 1.06; Hemoglobin 15.2; Platelets 193; Potassium 4.5; Sodium 138    Lipid Panel No results found for: CHOL, TRIG, HDL, CHOLHDL, VLDL, LDLCALC, LDLDIRECT   Wt Readings from Last 3 Encounters:  01/13/17 169 lb (76.7 kg)  06/06/16 174 lb 1.9 oz (79 kg)  03/10/16 163 lb 3.2 oz (74 kg)     Other studies  Reviewed: Additional studies/ records that were reviewed today include: Office notes and testing.  ASSESSMENT AND PLAN:  1.  Atrial fibrillation:  I have personally reviewed his home [name] strips over the past several weeks . Has PAF   CHADS2VSC is 0   - is on  ASA 81 Still has Xarelto on hand  And propranolol on hand    Had a long discussion about long term  consequences of paroxysmal A. fib ablation. We discussed the long-term effects of medications and also the risks and benefits regarding A. fib ablation.  2. Anticoagulation: CHA2DS2VASc=0, he is less than 69 years old, he has no history of diabetes, hypertension, CHF, CAD or CVA, and is not male.  3.  Sexual dysfunction:   Wants to try generic Sildenafil 20 mg, # 50.  Written script to Jiles Crocker, MD  01/13/2017 9:44 AM    [facility] [address] [place],  [address] [place], [place]  [address] Pager [phone] Phone: [phone]; Fax: [phone]

## 2017-01-13 NOTE — Patient Instructions (Signed)

## 2017-01-29 DIAGNOSIS — Z23 Encounter for immunization: Secondary | ICD-10-CM | POA: Diagnosis not present

## 2017-04-14 DIAGNOSIS — Z125 Encounter for screening for malignant neoplasm of prostate: Secondary | ICD-10-CM | POA: Diagnosis not present

## 2017-04-14 DIAGNOSIS — R946 Abnormal results of thyroid function studies: Secondary | ICD-10-CM | POA: Diagnosis not present

## 2017-04-14 DIAGNOSIS — Z0000 Encounter for general adult medical examination without abnormal findings: Secondary | ICD-10-CM | POA: Diagnosis not present

## 2017-04-20 DIAGNOSIS — Z0000 Encounter for general adult medical examination without abnormal findings: Secondary | ICD-10-CM | POA: Diagnosis not present

## 2017-04-20 DIAGNOSIS — D7589 Other specified diseases of blood and blood-forming organs: Secondary | ICD-10-CM | POA: Diagnosis not present

## 2017-04-20 DIAGNOSIS — R9431 Abnormal electrocardiogram [ECG] [EKG]: Secondary | ICD-10-CM | POA: Diagnosis not present

## 2017-04-20 DIAGNOSIS — Z1389 Encounter for screening for other disorder: Secondary | ICD-10-CM | POA: Diagnosis not present

## 2017-04-20 DIAGNOSIS — I480 Paroxysmal atrial fibrillation: Secondary | ICD-10-CM | POA: Diagnosis not present

## 2017-04-20 DIAGNOSIS — R002 Palpitations: Secondary | ICD-10-CM | POA: Diagnosis not present

## 2017-04-20 DIAGNOSIS — Z23 Encounter for immunization: Secondary | ICD-10-CM | POA: Diagnosis not present

## 2017-04-21 DIAGNOSIS — Z1212 Encounter for screening for malignant neoplasm of rectum: Secondary | ICD-10-CM | POA: Diagnosis not present

## 2017-06-23 DIAGNOSIS — H40013 Open angle with borderline findings, low risk, bilateral: Secondary | ICD-10-CM | POA: Diagnosis not present

## 2017-06-23 DIAGNOSIS — H25013 Cortical age-related cataract, bilateral: Secondary | ICD-10-CM | POA: Diagnosis not present

## 2017-06-23 DIAGNOSIS — H04123 Dry eye syndrome of bilateral lacrimal glands: Secondary | ICD-10-CM | POA: Diagnosis not present

## 2017-06-23 DIAGNOSIS — H2513 Age-related nuclear cataract, bilateral: Secondary | ICD-10-CM | POA: Diagnosis not present

## 2017-08-23 NOTE — Progress Notes (Signed)
Cardiology Office Note   Date:  08/24/2017   ID:  Mike, Mccann 1960-12-31, MRN [id number]  PCP:  Rodrigo Ran, MD  Cardiologist:   Kristeen Miss, MD   Chief Complaint  Patient presents with  . Atrial Fibrillation   Problem list 1. Paroxysmal atrial fibrillation ( CHADS2VASC = 0)  2. Hyperlipidemia   Previous notes from Sept. 28, 2017 Mike Mccann) :   Mike Mccann is a 57 y.o. male with a history of CP> echo 2013 w/ mild-mod biatrial dilat, EF 55%, MV 2014 w/ diaphragmatic attn, EF 44%, hx tachycardia (never caught), GERD, HLD.   Mike Mccann presents for evaluation of syncope, palpitations  He has had intermittent palpitations and tachycardia for several years. The episodes were brief and never caught on ECG. He was not terribly symptomatic with them. However recently, he has had longer duration of just not feeling well, and feeling his heart beat out of rhythm at times. He has not had problems with tachycardia.  Starting in the spring, he would have episodes where he did not feel well but then would feel normal for a while. However, over the last couple of months, he has felt a little bit bad all the time. His heart rate has been running higher than usual, in the 60s and 70s. He has been able to exercise without difficulty. He has been waking up with a headache most days of the week. This is treated with Aleve with success.  This weekend, he had not done anything unusual and had not had anything unusual to eat and drink. He had not had a large amount of alcohol. He began feeling bad, he became diaphoretic and lightheaded. He noted on his feet that that his heart rate was 40. He sat down but did not feel much better. He had a syncopal episode. It did not last very long. EMS was called. Prior to EMS arrival, his heart rate was back up to 50 and he felt much better. He was not transported and no ECG is available. He did not document any heart rates < 40.   He gets  occasional nosebleeds in the winter months, and he uses saline nasal spray to try and prevent those. He has no other bleeding issues. He has had no chest pain or shortness of breath. He's had no increased dyspnea on exertion.   03/06/2016: Mike Mccann is seen today for follow-up of his paroxysmal atrial fibrillation. Since I last saw him in the hospital he has had a cardiac cath which revealed normal coronary arteries. He has normal left ventricle systolic function with an estimated ejection fraction of 55-65%. His echocardiogram reveals normal left ventricle systolic function. He has mild diastolic dysfunction. He has trace mitral regurgitation and mild tricuspid regurgitation.   He has been seen in the A. fib clinic on Oct. 11 , 2017.  He has been using the [place] monitor.    Has frequent episodes of PAF in the AM.   Typical episodes last 3 hours He has minimal symptoms if he is at work at his desk If he has to walk around , then he might have some lightheadedness and does not feel very well.   Feb. 2, 2018:  Mike Mccann is seen back for follow up of his atrial fib. Saw Dr. Johney Frame in Nov.    Mike Mccann does not want to start any arrhythmic therapy   CHADS2VASC is 0.   Takes Propranolol  Has had several episodes  of PAF.   Doesn't make him feel poorly but he can feel like his HR is Irreg.   Has started exercising recently  - swimming again recently    Sept. 11, 2018:  Mike Mccann is seen today for follow up . Is doing great. Has some occasional palpitations.   Has not had any prolonged episodes of Afib.  Exercising regularly .   Is exercising at a moderate pace.   August 24, 2017:  Mike Mccann is seen today for follow up of his PAF  Has not had any further episodes of atrial fib. Has some irregular beats - seems to be stress related.  Still has some palpitations once a week. Exercises about daily    Past Medical History:  Diagnosis Date  . Abnormal finding on EKG October 2013    a) 2-D Echo, Mod RA& mild LA  dilation, RVSP 33 mmHg. Nl LV function - EF > 55%. No valvular dz;; b) Myoview: ~ mild basal-mid inferoseptal ischemia: A low risk scan with excellent exercise capacity of 15 METS, exercising for 13 min, but horizontal ST depression in Lat leads;; c) repeat Myoview 02/2013: LOW RISK study w/ diaphragmatic attenuation   . Arthritis    hip  . Complication of anesthesia    given more medications than usual-"unsure if that happend"  . Dyslipidemia, goal LDL below 130    On statin, "ratio is really off"  . GERD (gastroesophageal reflux disease)   . Paroxysmal atrial fibrillation (HCC)   . Tachycardia     Past Surgical History:  Procedure Laterality Date  . CARDIAC CATHETERIZATION N/A 02/27/2016   Procedure: Left Heart Cath and Coronary Angiography;  Surgeon: [name], MD;  Location: [facility] INVASIVE CV LAB;  Service: Cardiovascular;  Laterality: N/A;  . CARDIAC CATHETERIZATION N/A 02/27/2016   Procedure: Intravascular Ultrasound/IVUS;  Surgeon: [name], MD;  Location: [facility] INVASIVE CV LAB;  Service: Cardiovascular;  Laterality: N/A;  . COLONOSCOPY  2 years ago  . TOTAL HIP ARTHROPLASTY Left 04/04/2014   Procedure: LEFT TOTAL HIP ARTHROPLASTY ANTERIOR APPROACH;  Surgeon: [name], MD;  Location: [facility] ORS;  Service: Orthopedics;  Laterality: Left;  . TOTAL HIP ARTHROPLASTY Right 05/02/2014   Procedure: TOTAL RIGHT HIP ARTHROPLASTY ANTERIOR APPROACH;  Surgeon: [name], MD;  Location: [facility] ORS;  Service: Orthopedics;  Laterality: Right;  [name] VASECTOMY  8 years ago  . WISDOM TOOTH EXTRACTION  10 years ago    Current Outpatient Medications  Medication Sig Dispense Refill  . aspirin EC 81 MG tablet Take 1 tablet (81 mg total) by mouth daily.    . Cyanocobalamin (VITAMIN B-12 PO) Take 1 tablet by mouth daily.    [name] MAGNESIUM PO Take 1 tablet by mouth daily.    . Multiple Vitamins-Minerals (MULTIVITAMIN ADULT PO) Take 1 tablet by mouth daily.    [name] oxymetazoline (AFRIN 12 HOUR) 0.05 %  nasal spray Place 1 spray into both nostrils 2 (two) times daily as needed for congestion.    . propranolol (INDERAL) 10 MG tablet Take 10 mg by mouth 4 (four) times daily as needed (heartrate control).    . simvastatin (ZOCOR) 40 MG tablet Take 40 mg by mouth daily.   2   No current facility-administered medications for this visit.     Allergies:   Patient has no known allergies.    Social History:  The patient  reports that he has never smoked. He has never used smokeless tobacco. He reports that he drinks alcohol. He  reports that he does not use drugs.   Family History:  The patient's family history includes Heart attack in his maternal grandfather and maternal uncle.    ROS:  Please see the history of present illness. All other systems are reviewed and negative.   Physical Exam: Blood pressure 126/78, pulse (!) 54, height 6' (1.829 m), weight 163 lb (73.9 kg), SpO2 97 %.  GEN:  Well nourished, well developed in no acute distress HEENT: Normal NECK: No JVD; No carotid bruits LYMPHATICS: No lymphadenopathy CARDIAC: RR RESPIRATORY:  Clear to auscultation without rales, wheezing or rhonchi  ABDOMEN: Soft, non-tender, non-distended MUSCULOSKELETAL:  No edema; No deformity  SKIN: Warm and dry NEUROLOGIC:  Alert and oriented x 3  EKG:    .   Recent Labs: No results found for requested labs within last 8760 hours.    Lipid Panel No results found for: CHOL, TRIG, HDL, CHOLHDL, VLDL, LDLCALC, LDLDIRECT   Wt Readings from Last 3 Encounters:  08/24/17 163 lb (73.9 kg)  01/13/17 169 lb (76.7 kg)  06/06/16 174 lb 1.9 oz (79 kg)     Other studies Reviewed: Additional studies/ records that were reviewed today include: Office notes and testing.  ASSESSMENT AND PLAN:  1.  Atrial fibrillation:  He still has the kardia monitor for use in evaluation of of hisp palpitations  Has PAF   CHADS2VSC is 0   - is on  ASA 81 Some Xarelto on hand and propranolol on hand if  needed.    2. Anticoagulation: CHA2DS2VASc=0, he is less than 58 years old, he has no history of diabetes, hypertension, CHF, CAD or CVA,   We discussed ASA and the fact that he did not have a strict indication for ASA but I'm ok with him continuing it for now .   Kristeen MissPhilip Everlean Bucher, MD  08/24/2017 9:13 AM    [facility] [address],  [address] [place], [place]  [address] Pager [phone] Phone: [phone]; Fax: [phone]

## 2017-08-24 ENCOUNTER — Ambulatory Visit (INDEPENDENT_AMBULATORY_CARE_PROVIDER_SITE_OTHER): Payer: BLUE CROSS/BLUE SHIELD | Admitting: Cardiovascular Disease

## 2017-08-24 ENCOUNTER — Encounter: Payer: Self-pay | Admitting: Cardiovascular Disease

## 2017-08-24 VITALS — BP 126/78 | HR 54 | Ht 72.0 in | Wt 163.0 lb

## 2017-08-24 DIAGNOSIS — I480 Paroxysmal atrial fibrillation: Secondary | ICD-10-CM

## 2017-08-24 NOTE — Patient Instructions (Signed)
Medication Instructions:  Your physician recommends that you continue on your current medications as directed. Please refer to the Current Medication list given to you today.   Labwork: None Ordered   Testing/Procedures: None Ordered   Follow-Up: Your physician wants you to follow-up in: 1 year with Dr. Nahser.  You will receive a reminder letter in the mail two months in advance. If you don't receive a letter, please call our office to schedule the follow-up appointment.   If you need a refill on your cardiac medications before your next appointment, please call your pharmacy.   Thank you for choosing [facility]! Ahniya Mitchum, RN [phone]    

## 2017-10-28 DIAGNOSIS — M79672 Pain in left foot: Secondary | ICD-10-CM | POA: Diagnosis not present

## 2017-10-28 DIAGNOSIS — S93402A Sprain of unspecified ligament of left ankle, initial encounter: Secondary | ICD-10-CM | POA: Diagnosis not present

## 2018-01-08 DIAGNOSIS — M25511 Pain in right shoulder: Secondary | ICD-10-CM | POA: Diagnosis not present

## 2018-01-18 DIAGNOSIS — M25511 Pain in right shoulder: Secondary | ICD-10-CM | POA: Diagnosis not present

## 2018-01-19 DIAGNOSIS — Z86010 Personal history of colonic polyps: Secondary | ICD-10-CM | POA: Diagnosis not present

## 2018-01-19 DIAGNOSIS — Z800 Family history of malignant neoplasm of digestive organs: Secondary | ICD-10-CM | POA: Diagnosis not present

## 2018-01-19 DIAGNOSIS — Z1211 Encounter for screening for malignant neoplasm of colon: Secondary | ICD-10-CM | POA: Diagnosis not present

## 2018-01-25 DIAGNOSIS — M25511 Pain in right shoulder: Secondary | ICD-10-CM | POA: Diagnosis not present

## 2018-01-26 DIAGNOSIS — Z23 Encounter for immunization: Secondary | ICD-10-CM | POA: Diagnosis not present

## 2018-02-01 DIAGNOSIS — M25511 Pain in right shoulder: Secondary | ICD-10-CM | POA: Diagnosis not present

## 2018-02-19 DIAGNOSIS — M25511 Pain in right shoulder: Secondary | ICD-10-CM | POA: Diagnosis not present

## 2018-03-09 ENCOUNTER — Telehealth: Payer: Self-pay | Admitting: Nurse Practitioner

## 2018-03-09 ENCOUNTER — Encounter: Payer: Self-pay | Admitting: Cardiovascular Disease

## 2018-03-09 ENCOUNTER — Ambulatory Visit: Payer: BLUE CROSS/BLUE SHIELD | Admitting: Cardiovascular Disease

## 2018-03-09 VITALS — BP 110/72 | HR 63 | Ht 72.0 in | Wt 155.0 lb

## 2018-03-09 DIAGNOSIS — D123 Benign neoplasm of transverse colon: Secondary | ICD-10-CM | POA: Diagnosis not present

## 2018-03-09 DIAGNOSIS — I480 Paroxysmal atrial fibrillation: Secondary | ICD-10-CM

## 2018-03-09 DIAGNOSIS — K529 Noninfective gastroenteritis and colitis, unspecified: Secondary | ICD-10-CM | POA: Diagnosis not present

## 2018-03-09 DIAGNOSIS — Z1211 Encounter for screening for malignant neoplasm of colon: Secondary | ICD-10-CM | POA: Diagnosis not present

## 2018-03-09 DIAGNOSIS — K633 Ulcer of intestine: Secondary | ICD-10-CM | POA: Diagnosis not present

## 2018-03-09 DIAGNOSIS — K6389 Other specified diseases of intestine: Secondary | ICD-10-CM | POA: Diagnosis not present

## 2018-03-09 DIAGNOSIS — K635 Polyp of colon: Secondary | ICD-10-CM | POA: Diagnosis not present

## 2018-03-09 DIAGNOSIS — Z800 Family history of malignant neoplasm of digestive organs: Secondary | ICD-10-CM | POA: Diagnosis not present

## 2018-03-09 MED ORDER — PROPRANOLOL HCL 10 MG PO TABS: 10.0000 mg | ORAL_TABLET | Freq: Four times a day (QID) | ORAL | 6 refills | Status: DC | PRN

## 2018-03-09 NOTE — Telephone Encounter (Signed)
Mike Mccann has gone into atrial flutter during colonocsopy  Please call in Propranolol 10 mg to take as needed  Will call him at lunch and see how he is doing   Received call from patient's wife who spoke with Dr. Elease Hashimoto. He advised her to have patient eat something salty and drink fluids and assess how patient is feeling. Propranolol 10 mg has been sent to patient's pharmacy and he advised wife to give patient one if HR remains high. She is a physician and is concerned that patient is having pauses. Dr. Elease Hashimoto advised her to bring patient in at 12 noon today.

## 2018-03-09 NOTE — Progress Notes (Signed)
Cardiology Office Note   Date:  03/09/2018   ID:  [name], [name] 1960/09/11, MRN [id number]  PCP:  Rodrigo Ran, MD  Cardiologist:   Kristeen Miss, MD   Chief Complaint  Patient presents with  . Atrial Fibrillation   Problem list 1. Paroxysmal atrial fibrillation ( CHADS2VASC = 0)  2. Hyperlipidemia   Previous notes from Sept. 28, 2017 Mike Mccann) :   Mike Mccann is a 57 y.o. male with a history of CP> echo 2013 w/ mild-mod biatrial dilat, EF 55%, MV 2014 w/ diaphragmatic attn, EF 44%, hx tachycardia (never caught), GERD, HLD.   Mike Mccann presents for evaluation of syncope, palpitations  He has had intermittent palpitations and tachycardia for several years. The episodes were brief and never caught on ECG. He was not terribly symptomatic with them. However recently, he has had longer duration of just not feeling well, and feeling his heart beat out of rhythm at times. He has not had problems with tachycardia.  Starting in the spring, he would have episodes where he did not feel well but then would feel normal for a while. However, over the last couple of months, he has felt a little bit bad all the time. His heart rate has been running higher than usual, in the 60s and 70s. He has been able to exercise without difficulty. He has been waking up with a headache most days of the week. This is treated with Aleve with success.  This weekend, he had not done anything unusual and had not had anything unusual to eat and drink. He had not had a large amount of alcohol. He began feeling bad, he became diaphoretic and lightheaded. He noted on his feet that that his heart rate was 40. He sat down but did not feel much better. He had a syncopal episode. It did not last very long. EMS was called. Prior to EMS arrival, his heart rate was back up to 50 and he felt much better. He was not transported and no ECG is available. He did not document any heart rates < 40.   He gets  occasional nosebleeds in the winter months, and he uses saline nasal spray to try and prevent those. He has no other bleeding issues. He has had no chest pain or shortness of breath. He's had no increased dyspnea on exertion.   03/06/2016: Mike Mccann is seen today for follow-up of his paroxysmal atrial fibrillation. Since I last saw him in the hospital he has had a cardiac cath which revealed normal coronary arteries. He has normal left ventricle systolic function with an estimated ejection fraction of 55-65%. His echocardiogram reveals normal left ventricle systolic function. He has mild diastolic dysfunction. He has trace mitral regurgitation and mild tricuspid regurgitation.   He has been seen in the A. fib clinic on Oct. 11 , 2017.  He has been using the [place] monitor.    Has frequent episodes of PAF in the AM.   Typical episodes last 3 hours He has minimal symptoms if he is at work at his desk If he has to walk around , then he might have some lightheadedness and does not feel very well.   Feb. 2, 2018:  Mike Mccann is seen back for follow up of his atrial fib. Saw Dr. Johney Frame in Nov.    Mike Mccann does not want to start any arrhythmic therapy   CHADS2VASC is 0.   Takes Propranolol  Has had several episodes  of PAF.   Doesn't make him feel poorly but he can feel like his HR is Irreg.   Has started exercising recently  - swimming again recently    Sept. 11, 2018:  Mike Mccann is seen today for follow up . Is doing great. Has some occasional palpitations.   Has not had any prolonged episodes of Afib.  Exercising regularly .   Is exercising at a moderate pace.   August 24, 2017:  Mike Mccann is seen today for follow up of his PAF  Has not had any further episodes of atrial fib. Has some irregular beats - seems to be stress related.  Still has some palpitations once a week. Exercises about daily   March 09, 2018:  Mike Mccann is seen today for a recurrent episode of paroxysmal atrial fibrillation.  He had a  colonoscopy this morning and was noted to have atrial fibrillation during the procedure.  He was largely asymptomatic but he did feel a little lethargic from the procedure.  He went home and ate some chicken noodle soup.  He took several ECG readings with his [name] - he was definitely in atrial fibrillation.  He then later converted to normal sinus rhythm.  He is feeling well now.  Has any syncope or presyncope   Past Medical History:  Diagnosis Date  . Abnormal finding on EKG October 2013    a) 2-D Echo, Mod RA& mild LA dilation, RVSP 33 mmHg. Nl LV function - EF > 55%. No valvular dz;; b) Myoview: ~ mild basal-mid inferoseptal ischemia: A low risk scan with excellent exercise capacity of 15 METS, exercising for 13 min, but horizontal ST depression in Lat leads;; c) repeat Myoview 02/2013: LOW RISK study w/ diaphragmatic attenuation   . Arthritis    hip  . Complication of anesthesia    given more medications than usual-"unsure if that happend"  . Dyslipidemia, goal LDL below 130    On statin, "ratio is really off"  . GERD (gastroesophageal reflux disease)   . Paroxysmal atrial fibrillation (HCC)   . Tachycardia     Past Surgical History:  Procedure Laterality Date  . CARDIAC CATHETERIZATION N/A 02/27/2016   Procedure: Left Heart Cath and Coronary Angiography;  Surgeon: [name], MD;  Location: [facility] INVASIVE CV LAB;  Service: Cardiovascular;  Laterality: N/A;  . CARDIAC CATHETERIZATION N/A 02/27/2016   Procedure: Intravascular Ultrasound/IVUS;  Surgeon: [name], MD;  Location: [facility] INVASIVE CV LAB;  Service: Cardiovascular;  Laterality: N/A;  . COLONOSCOPY  2 years ago  . TOTAL HIP ARTHROPLASTY Left 04/04/2014   Procedure: LEFT TOTAL HIP ARTHROPLASTY ANTERIOR APPROACH;  Surgeon: [name], MD;  Location: [facility] ORS;  Service: Orthopedics;  Laterality: Left;  . TOTAL HIP ARTHROPLASTY Right 05/02/2014   Procedure: TOTAL RIGHT HIP ARTHROPLASTY ANTERIOR APPROACH;   Surgeon: [name], MD;  Location: [facility] ORS;  Service: Orthopedics;  Laterality: Right;  [name] VASECTOMY  8 years ago  . WISDOM TOOTH EXTRACTION  10 years ago    Current Outpatient Medications  Medication Sig Dispense Refill  . Cyanocobalamin (VITAMIN B-12 PO) Take 1 tablet by mouth daily.    [name] MAGNESIUM PO Take 1 tablet by mouth daily.    . Multiple Vitamins-Minerals (MULTIVITAMIN ADULT PO) Take 1 tablet by mouth daily.    [name] oxymetazoline (AFRIN 12 HOUR) 0.05 % nasal spray Place 1 spray into both nostrils 2 (two) times daily as needed for congestion.    . propranolol (INDERAL) 10 MG tablet Take  1 tablet (10 mg total) by mouth 4 (four) times daily as needed (heartrate control). 60 tablet 6  . simvastatin (ZOCOR) 40 MG tablet Take 40 mg by mouth daily.   2  . aspirin EC 81 MG tablet Take 1 tablet (81 mg total) by mouth daily. (Patient not taking: Reported on 03/09/2018)     No current facility-administered medications for this visit.     Allergies:   Patient has no known allergies.    Social History:  The patient  reports that he has never smoked. He has never used smokeless tobacco. He reports that he drinks alcohol. He reports that he does not use drugs.   Family History:  The patient's family history includes Heart attack in his maternal grandfather and maternal uncle.    ROS:  Please see the history of present illness. All other systems are reviewed and negative.   Physical Exam: Blood pressure 110/72, pulse 63, height 6' (1.829 m), weight 155 lb (70.3 kg), SpO2 99 %.  GEN: Middle-aged gentleman, no acute distress. HEENT: Normal NECK: No JVD; No carotid bruits LYMPHATICS: No lymphadenopathy CARDIAC: RR RESPIRATORY:  Clear to auscultation without rales, wheezing or rhonchi  ABDOMEN: Soft, non-tender, non-distended MUSCULOSKELETAL:  No edema; No deformity  SKIN: Warm and dry NEUROLOGIC:  Alert and oriented x 3  EKG:    .   Recent Labs: No results found for requested labs  within last 8760 hours.    Lipid Panel No results found for: CHOL, TRIG, HDL, CHOLHDL, VLDL, LDLCALC, LDLDIRECT   Wt Readings from Last 3 Encounters:  03/09/18 155 lb (70.3 kg)  08/24/17 163 lb (73.9 kg)  01/13/17 169 lb (76.7 kg)     Other studies Reviewed: Additional studies/ records that were reviewed today include: Office notes and testing.  ASSESSMENT AND PLAN:  1.  Atrial fibrillation:  Feel went into atrial fibrillation this morning during his colonoscopy.  It is likely that the colon prep in the anesthesia may have contributed to his episode of atrial fibrillation. Now converted back to sinus rhythm after eating some soup.  He feels well.   CHADS2VSC is 0   - is on  ASA 81 Some Xarelto on hand and propranolol on hand if needed.  Gust the fact that he should start his Xarelto if he remains in atrial fibrillation for more than 48 hours.  He may take a propranolol on an as-needed basis to help converted back to sinus rhythm.   2. Anticoagulation: CHA2DS2VASc=0,     Kristeen Miss, MD  03/09/2018 12:11 PM    [facility] [address] [place],  [address] [place], [place]  [address] Pager [phone] Phone: [phone]; Fax: [phone]

## 2018-03-09 NOTE — Patient Instructions (Signed)

## 2018-04-07 DIAGNOSIS — Z125 Encounter for screening for malignant neoplasm of prostate: Secondary | ICD-10-CM | POA: Diagnosis not present

## 2018-04-07 DIAGNOSIS — Z0000 Encounter for general adult medical examination without abnormal findings: Secondary | ICD-10-CM | POA: Diagnosis not present

## 2018-04-07 DIAGNOSIS — R82998 Other abnormal findings in urine: Secondary | ICD-10-CM | POA: Diagnosis not present

## 2018-04-07 DIAGNOSIS — R946 Abnormal results of thyroid function studies: Secondary | ICD-10-CM | POA: Diagnosis not present

## 2018-04-14 DIAGNOSIS — D7589 Other specified diseases of blood and blood-forming organs: Secondary | ICD-10-CM | POA: Diagnosis not present

## 2018-04-14 DIAGNOSIS — Z1389 Encounter for screening for other disorder: Secondary | ICD-10-CM | POA: Diagnosis not present

## 2018-04-14 DIAGNOSIS — R9431 Abnormal electrocardiogram [ECG] [EKG]: Secondary | ICD-10-CM | POA: Diagnosis not present

## 2018-04-14 DIAGNOSIS — Z0000 Encounter for general adult medical examination without abnormal findings: Secondary | ICD-10-CM | POA: Diagnosis not present

## 2018-04-14 DIAGNOSIS — I7300 Raynaud's syndrome without gangrene: Secondary | ICD-10-CM | POA: Diagnosis not present

## 2018-04-14 DIAGNOSIS — I480 Paroxysmal atrial fibrillation: Secondary | ICD-10-CM | POA: Diagnosis not present

## 2018-04-16 DIAGNOSIS — Z1212 Encounter for screening for malignant neoplasm of rectum: Secondary | ICD-10-CM | POA: Diagnosis not present

## 2018-05-12 DIAGNOSIS — H2513 Age-related nuclear cataract, bilateral: Secondary | ICD-10-CM | POA: Diagnosis not present

## 2018-05-12 DIAGNOSIS — H40013 Open angle with borderline findings, low risk, bilateral: Secondary | ICD-10-CM | POA: Diagnosis not present

## 2018-05-12 DIAGNOSIS — H25013 Cortical age-related cataract, bilateral: Secondary | ICD-10-CM | POA: Diagnosis not present

## 2018-07-08 ENCOUNTER — Telehealth: Payer: Self-pay | Admitting: Nurse Practitioner

## 2018-07-08 MED ORDER — METOPROLOL SUCCINATE ER 25 MG PO TB24: 12.5000 mg | ORAL_TABLET | Freq: Every day | ORAL | 11 refills | Status: DC

## 2018-07-08 NOTE — Telephone Encounter (Signed)
Received message from patient through MyChart regarding increased episodesof a fib with symptoms of fatigue and feeling poor. Discussed plan of care with Dr. Elease Hashimoto who advised patient may d/c propranolol and start Toprol XL 12.5 mg daily.  Called patient who states he has been exercising more vigorously and wonders if this has caused the increase in symptoms. He states the propranolol has helped but it makes him very tired. We discussed options of scheduling an appointment with APP tomorrow or having him stop propranolol to try Toprol XL 12.5 mg daily. He reports his HR generally runs 55-65 bpm and he is aware to monitor this while taking Toprol. He will start Toprol XL 12.5 mg QD and is scheduled to see Dr. Elease Hashimoto on 3/19. I advised him to call back with questions or concerns prior to appointment. He verbalized understanding and agreement with plan and thanked me for the call.

## 2018-07-09 ENCOUNTER — Ambulatory Visit (HOSPITAL_COMMUNITY): Payer: BLUE CROSS/BLUE SHIELD | Admitting: Nurse Practitioner

## 2018-07-09 ENCOUNTER — Ambulatory Visit: Payer: BLUE CROSS/BLUE SHIELD | Admitting: Physician Assistant

## 2018-07-22 ENCOUNTER — Ambulatory Visit: Payer: BLUE CROSS/BLUE SHIELD | Admitting: Cardiovascular Disease

## 2018-08-19 NOTE — Progress Notes (Signed)
Virtual Visit via Telephone Note   This visit type was conducted due to national recommendations for restrictions regarding the COVID-19 Pandemic (e.g. social distancing) in an effort to limit this patient's exposure and mitigate transmission in our community.  Due to his co-morbid illnesses, this patient is at least at moderate risk for complications without adequate follow up.  This format is felt to be most appropriate for this patient at this time.  The patient did not have access to video technology/had technical difficulties with video requiring transitioning to audio format only (telephone).  All issues noted in this document were discussed and addressed.  No physical exam could be performed with this format.  Please refer to the patient's chart for his  consent to telehealth for [facility].   Evaluation Performed:  Follow-up visit  Date:  08/20/2018   ID:  Mathan, Darroch [birth year], MRN [id number]  Patient Location: Home Provider Location: Home  PCP:  Rodrigo Ran, MD  Cardiologist:  Kristeen Miss, MD  Electrophysiologist:  None   Chief Complaint:  Atrial fibrillation  History of Present Illness:    DELNO BLAISDELL is a 58 y.o. male with past medical history significant for paroxysmal atrial fibrillation, GERD and hyperlipidemia. He had LHC in 02/2016 that showed normal coronary arteries. Echo in 2017showed normal LV systolic with EF 40-98%, mild diastolic dysfunction. He has trace mitral regurgitation and mild tricuspid regurgitation.   Mr. Pullin has been using propranolol as needed to treat bouts of afib. On 07/08/2018 the patient called in with reports of increased atrial fibrillation but it was not sustained. He was complaining of symptoms of fatigue and not feeling well with the A. fib.  Dr. Elease Hashimoto advised to stop propranolol and initiate Toprol-XL 12.5 mg daily however the patient was reluctant due to his low baseline heart rate.   Today, Mr. Fesler states that his Afib  is "pretty much under control". He is watching his BP and on his Kardia APP. Last afib 07/22/18. Usually occurs with vigorous exercise. It usually resolves with rest and propranolol. On 3/19 he went into afib during vigorous exercis and it lasted overnight. This was the longest recent episode. He does better if he enters exercise slowly and cools down slowly. Rates in afib 70's-80's.   He tried metoprolol  1/2 dose and HR went to 40. He then tried 1/4 tab but still made his tired. He is no longer taking it.   He has been laid off from his job and he thinks that his stress level is better. He is getting more sleep and not doing so much vigorous exercise. He has not needed the prn prapanolol in about a month. He has had no chest pain, shortness of breath, lightheadedness or syncope.   He is aware to get good sleep, reduce stress, keep hydrated and do a good warm up and cool down with exercise.   Per Lourena Simmonds App done during appt regular rhythm at 60 bpm.   Home BPs run 120's-130's.   CHA2DS2/VAS Stroke Risk Score is 0. No indication for anticoagulation. Pt continues on aspirin 81 mg. The patient has Xarelto on hand to start if needed. He has been told in the past by Dr. Elease Hashimoto that if he sustains atrial fibrillation for 48 hours he should start anticoagulation.    The patient does not have symptoms concerning for COVID-19 infection (fever, chills, cough, or new shortness of breath).    Past Medical History:  Diagnosis Date  .  Abnormal finding on EKG October 2013    a) 2-D Echo, Mod RA& mild LA dilation, RVSP 33 mmHg. Nl LV function - EF > 55%. No valvular dz;; b) Myoview: ~ mild basal-mid inferoseptal ischemia: A low risk scan with excellent exercise capacity of 15 METS, exercising for 13 min, but horizontal ST depression in Lat leads;; c) repeat Myoview 02/2013: LOW RISK study w/ diaphragmatic attenuation   . Arthritis    hip  . Complication of anesthesia    given more medications than  usual-"unsure if that happend"  . Dyslipidemia, goal LDL below 130    On statin, "ratio is really off"  . GERD (gastroesophageal reflux disease)   . Paroxysmal atrial fibrillation (HCC)   . Tachycardia    Past Surgical History:  Procedure Laterality Date  . CARDIAC CATHETERIZATION N/A 02/27/2016   Procedure: Left Heart Cath and Coronary Angiography;  Surgeon: [name], MD;  Location[facility]VAS[facility] CV LAB;  Service: Cardiovascular;  Laterality: N/A;  . CARDIAC CATHETERIZATION N/A 02/27/2016   Procedure: Intravascular Ultrasound/IVUS;  Surgeon: [name], MD;  Location: [facility] INVASIVE CV LAB;  Service: Cardiovascular;  Laterality: N/A;  . COLONOSCOPY  2 years ago  . TOTAL HIP ARTHROPLASTY Left 04/04/2014   Procedure: LEFT TOTAL HIP ARTHROPLASTY ANTERIOR APPROACH;  Surgeon: [name], MD;  Location: [facility] ORS;  Service: Orthopedics;  Laterality: Left;  . TOTAL HIP ARTHROPLASTY Right 05/02/2014   Procedure: TOTAL RIGHT HIP ARTHROPLASTY ANTERIOR APPROACH;  Surgeon: [name], MD;  Location: [facility];  Service: Orthopedics;  Laterality: Right;  [name] VASECTOMY  8 years ago  . WISDOM TOOTH EXTRACTION  10 years ago     Current Meds  Medication Sig  . Cyanocobalamin (VITAMIN B-12 PO) Take 1 tablet by mouth daily.  [name] MAGNESIUM PO Take 1 tablet by mouth daily.  . Multiple Vitamins-Minerals (MULTIVITAMIN ADULT PO) Take 1 tablet by mouth daily.  [name] oxymetazoline (AFRIN 12 HOUR) 0.05 % nasal spray Place 1 spray into both nostrils 2 (two) times daily as needed for congestion.  . simvastatin (ZOCOR) 40 MG tablet Take 40 mg by mouth daily.      Allergies:   Patient has no known allergies.   Social History   Tobacco Use  . Smoking status: Never Smoker  . Smokeless tobacco: Never Used  Substance Use Topics  . Alcohol use: Yes    Comment: wine once a month  . Drug use: No     Family Hx: The patient's family history includes Heart attack in his maternal grandfather and maternal uncle.   ROS:   Please see the history of present illness.     All other systems reviewed and are negative.   Prior CV studies:   The following studies were reviewed today:  Left Heart Cath 02/27/2016 Conclusions: 1. Large, tortuous LMCA with focal kink in the midportion of the vessel. There is some luminal narrowing at the kink, though this is not significant by IVUS.  There is no atherosclerotic disease by IVUS involving the LMCA or proximal LAD. 2. No angiographically significant coronary artery disease. 3. Normal left ventricular filling pressure. 4. Normal left ventricular contraction.  Recommendations: 1. Continue primary prevention of atherosclerotic cardiovascular disease. 2. Follow-up with Dr. Johney Frame regarding management of atrial fibrillation and syncopal episode. 3. Restart rivaroxaban tomorrow.  Echocardiogram 02/18/2016 Study Conclusions - Left ventricle: The cavity size was normal. Wall thickness was   normal. Systolic function was normal. The estimated ejection   fraction was in  the range of 50% to 55%. Wall motion was normal;   there were no regional wall motion abnormalities. Doppler   parameters are consistent with abnormal left ventricular   relaxation (grade 1 diastolic dysfunction). - Aortic root: The aortic root was mildly dilated. - Atrial septum: There was an atrial septal aneurysm.  Impressions: - Low normal LV systolic function; grade 1 diastolic dysfunction;   redundant MV chordae with trace MR; mld TR; mildly dilated aortic   root.    Labs/Other Tests and Data Reviewed:    EKG:  An ECG dated 01/13/2017 was personally reviewed today and demonstrated:  Sinus bradycardia. EKG on 03/09/2018 demonstrated atrial fibrillation in the setting of colonoscopy. Later that day converted to SR per Kardia.  Kardia APP rhythm during this visit showed regular rhythm at 60 bpm.   Recent Labs: No results found for requested labs within last 8760 hours.   Recent  Lipid Panel No results found for: CHOL, TRIG, HDL, CHOLHDL, LDLCALC, LDLDIRECT  Wt Readings from Last 3 Encounters:  08/20/18 152 lb (68.9 kg)  03/09/18 155 lb (70.3 kg)  08/24/17 163 lb (73.9 kg)     Objective:    Vital Signs:  BP 138/80   Pulse (!) 56   Ht 6' (1.829 m)   Wt 152 lb (68.9 kg)   BMI 20.61 kg/m     ASSESSMENT & PLAN:    1.  Paroxysmal atrial fibrillation -Patient was having increased episodes of Afib mostly related to vigorous exercise. Tried low dose metoprolol but did not tolerate it due to low baseline HR and fatigue. His Afib episodes have decreased with being laid off during COVID and decrease in work stress.  He continues on propranolol 10 mg as needed as is doing well. He uses Kardia APP and has a good handle on identifying his afib episodes, longest was overnight on 3/19. None since.  I reassured him that it is OK to leave the maintenance metoprolol off and continue to use propranolol as needed.  -CHA2DS2/VAS Stroke Risk Score is 0. No indication for anticoagulation. Pt continues on aspirin 81 mg. The patient has Xarelto on hand to start if needed. He has been told in the past by Dr. Elease HashimotoNahser that if he sustains atrial fibrillation for 48 hours he should start anticoagulation.   COVID-19 Education: The signs and symptoms of COVID-19 were discussed with the patient and how to seek care for testing (follow up with PCP or arrange E-visit).  The importance of social distancing was discussed today.  Time:   Today, I have spent 22 minutes with the patient with telehealth technology discussing the above problems.     Medication Adjustments/Labs and Tests Ordered: Current medicines are reviewed at length with the patient today.  Concerns regarding medicines are outlined above.   Tests Ordered: No orders of the defined types were placed in this encounter.   Medication Changes: Meds ordered this encounter  Medications  . DISCONTD: propranolol (INDERAL) 10 MG  tablet    Sig: Take 1 tablet (10 mg total) by mouth 4 (four) times daily as needed (Heart rate control).    Dispense:  60 tablet    Refill:  6  . propranolol (INDERAL) 10 MG tablet    Sig: Take 1 tablet (10 mg total) by mouth 4 (four) times daily as needed (Heart rate control).    Dispense:  120 tablet    Refill:  11    Disposition:  Follow up in 6 month(s)  Signed,  Berton Bon, NP  08/20/2018 10:19 AM    [facility]

## 2018-08-20 ENCOUNTER — Other Ambulatory Visit: Payer: Self-pay

## 2018-08-20 ENCOUNTER — Telehealth (INDEPENDENT_AMBULATORY_CARE_PROVIDER_SITE_OTHER): Payer: Self-pay | Admitting: Cardiology

## 2018-08-20 ENCOUNTER — Encounter: Payer: Self-pay | Admitting: Cardiology

## 2018-08-20 VITALS — BP 138/80 | HR 56 | Ht 72.0 in | Wt 152.0 lb

## 2018-08-20 DIAGNOSIS — I480 Paroxysmal atrial fibrillation: Secondary | ICD-10-CM

## 2018-08-20 MED ORDER — PROPRANOLOL HCL 10 MG PO TABS: 10.0000 mg | ORAL_TABLET | Freq: Four times a day (QID) | ORAL | 11 refills | Status: DC | PRN

## 2018-08-20 MED ORDER — PROPRANOLOL HCL 10 MG PO TABS: 10.0000 mg | ORAL_TABLET | Freq: Four times a day (QID) | ORAL | 6 refills | Status: DC | PRN

## 2018-08-20 NOTE — Patient Instructions (Signed)
Medication Instructions:  RESUME: Propranolol 10 mg 4 times a day as needed for heart rate control  If you need a refill on your cardiac medications before your next appointment, please call your pharmacy.   Lab work: None  If you have labs (blood work) drawn today and your tests are completely normal, you will receive your results only by: [name] MyChart Message (if you have MyChart) OR . A paper copy in the mail If you have any lab test that is abnormal or we need to change your treatment, we will call you to review the results.  Testing/Procedures: None   Follow-Up: At [facility], you and your health needs are our priority.  As part of our continuing mission to provide you with exceptional heart care, we have created designated Provider Care Teams.  These Care Teams include your primary Cardiologist (physician) and Advanced Practice Providers (APPs -  Physician Assistants and Nurse Practitioners) who all work together to provide you with the care you need, when you need it. You will need a follow up appointment in:  6 months.  Please call our office 2 months in advance to schedule this appointment.  You may see Kristeen Miss, MD or one of the following Advanced Practice Providers on your designated Care Team: Tereso Newcomer, PA-C Vin Strawberry, [place] . Berton Bon, NP  Any Other Special Instructions Will Be Listed Below (If Applicable).

## 2018-08-23 NOTE — Telephone Encounter (Signed)
I reviewed Kardia strips with Dr. Ladona Ridgel. Sinus rhythm with some occ artifact. No afib or flutter. Nothing concerning.  I called pt and he says he had been feeling off over the weekend and Lourena Simmonds was reading indeterminate rhythm.  He is feeling better today. I reassured him that heart rhythm is normal. He will continue to monitor as he feels and send strips in if abnormal findings.

## 2018-08-31 DIAGNOSIS — R140 Abdominal distension (gaseous): Secondary | ICD-10-CM | POA: Diagnosis not present

## 2018-08-31 DIAGNOSIS — Z800 Family history of malignant neoplasm of digestive organs: Secondary | ICD-10-CM | POA: Diagnosis not present

## 2018-08-31 DIAGNOSIS — Z86010 Personal history of colonic polyps: Secondary | ICD-10-CM | POA: Diagnosis not present

## 2018-08-31 DIAGNOSIS — R194 Change in bowel habit: Secondary | ICD-10-CM | POA: Diagnosis not present

## 2018-09-10 ENCOUNTER — Telehealth: Payer: Self-pay

## 2018-09-10 NOTE — Telephone Encounter (Signed)
Spoke with pt about upcoming appt with Dr. Elease Hashimoto. He expressed that he had just had an appt with the PA and felt no need for the appt. Pt denies any issues and was advised if anything changes to give [place] a call.

## 2018-09-21 ENCOUNTER — Ambulatory Visit: Payer: BLUE CROSS/BLUE SHIELD | Admitting: Cardiovascular Disease

## 2018-11-22 DIAGNOSIS — K600 Acute anal fissure: Secondary | ICD-10-CM | POA: Diagnosis not present

## 2018-11-22 DIAGNOSIS — R194 Change in bowel habit: Secondary | ICD-10-CM | POA: Diagnosis not present

## 2018-11-22 DIAGNOSIS — K921 Melena: Secondary | ICD-10-CM | POA: Diagnosis not present

## 2019-03-22 DIAGNOSIS — Z20828 Contact with and (suspected) exposure to other viral communicable diseases: Secondary | ICD-10-CM | POA: Diagnosis not present

## 2019-04-06 ENCOUNTER — Encounter: Payer: Self-pay | Admitting: Cardiovascular Disease

## 2019-04-06 ENCOUNTER — Other Ambulatory Visit: Payer: Self-pay

## 2019-04-06 ENCOUNTER — Ambulatory Visit: Payer: Federal, State, Local not specified - PPO | Admitting: Cardiovascular Disease

## 2019-04-06 VITALS — BP 112/80 | HR 51 | Ht 72.0 in | Wt 158.8 lb

## 2019-04-06 DIAGNOSIS — I480 Paroxysmal atrial fibrillation: Secondary | ICD-10-CM

## 2019-04-06 NOTE — Patient Instructions (Signed)
Medication Instructions:  Your physician recommends that you continue on your current medications as directed. Please refer to the Current Medication list given to you today.  *If you need a refill on your cardiac medications before your next appointment, please call your pharmacy*  Lab Work: None Ordered   Testing/Procedures: None Ordered   Follow-Up: At [facility], you and your health needs are our priority.  As part of our continuing mission to provide you with exceptional heart care, we have created designated Provider Care Teams.  These Care Teams include your primary Cardiologist (physician) and Advanced Practice Providers (APPs -  Physician Assistants and Nurse Practitioners) who all work together to provide you with the care you need, when you need it.  Your next appointment:   1 year(s)  The format for your next appointment:   In Person  Provider:   You may see Philip Nahser, MD or one of the following Advanced Practice Providers on your designated Care Team:    Scott Weaver, PA-C  Vin Bhagat, PA-C  Janine Hammond, NP    

## 2019-04-06 NOTE — Progress Notes (Signed)
Cardiology Office Note   Date:  04/06/2019   ID:  Mike, Mccann 04/17/1961, MRN [id number]  PCP:  Rodrigo Ran, MD  Cardiologist:   Kristeen Miss, MD   Chief Complaint  Patient presents with  . Atrial Fibrillation   Problem list 1. Paroxysmal atrial fibrillation ( CHADS2VASC = 0)  2. Hyperlipidemia   Previous notes from Sept. 28, 2017 Mike Mccann) :   Mike Mccann is a 58 y.o. male with a history of CP> echo 2013 w/ mild-mod biatrial dilat, EF 55%, MV 2014 w/ diaphragmatic attn, EF 44%, hx tachycardia (never caught), GERD, HLD.   [name] presents for evaluation of syncope, palpitations  He has had intermittent palpitations and tachycardia for several years. The episodes were brief and never caught on ECG. He was not terribly symptomatic with them. However recently, he has had longer duration of just not feeling well, and feeling his heart beat out of rhythm at times. He has not had problems with tachycardia.  Starting in the spring, he would have episodes where he did not feel well but then would feel normal for a while. However, over the last couple of months, he has felt a little bit bad all the time. His heart rate has been running higher than usual, in the 60s and 70s. He has been able to exercise without difficulty. He has been waking up with a headache most days of the week. This is treated with Aleve with success.  This weekend, he had not done anything unusual and had not had anything unusual to eat and drink. He had not had a large amount of alcohol. He began feeling bad, he became diaphoretic and lightheaded. He noted on his feet that that his heart rate was 40. He sat down but did not feel much better. He had a syncopal episode. It did not last very long. EMS was called. Prior to EMS arrival, his heart rate was back up to 50 and he felt much better. He was not transported and no ECG is available. He did not document any heart rates < 40.   He gets  occasional nosebleeds in the winter months, and he uses saline nasal spray to try and prevent those. He has no other bleeding issues. He has had no chest pain or shortness of breath. He's had no increased dyspnea on exertion.   03/06/2016: Mike Mccann is seen today for follow-up of his paroxysmal atrial fibrillation. Since I last saw him in the hospital he has had a cardiac cath which revealed normal coronary arteries. He has normal left ventricle systolic function with an estimated ejection fraction of 55-65%. His echocardiogram reveals normal left ventricle systolic function. He has mild diastolic dysfunction. He has trace mitral regurgitation and mild tricuspid regurgitation.   He has been seen in the A. fib clinic on Oct. 11 , 2017.  He has been using the [place] monitor.    Has frequent episodes of PAF in the AM.   Typical episodes last 3 hours He has minimal symptoms if he is at work at his desk If he has to walk around , then he might have some lightheadedness and does not feel very well.   Feb. 2, 2018:  Mike Mccann is seen back for follow up of his atrial fib. Saw Mike Mccann in Nov.    Mike Mccann does not want to start any arrhythmic therapy   CHADS2VASC is 0.   Takes Propranolol  Has had several episodes  of PAF.   Doesn't make him feel poorly but he can feel like his HR is Irreg.   Has started exercising recently  - swimming again recently    Sept. 11, 2018:  Mike Mccann is seen today for follow up . Is doing great. Has some occasional palpitations.   Has not had any prolonged episodes of Afib.  Exercising regularly .   Is exercising at a moderate pace.   August 24, 2017:  Mike Mccann is seen today for follow up of his PAF  Has not had any further episodes of atrial fib. Has some irregular beats - seems to be stress related.  Still has some palpitations once a week. Exercises about daily   March 09, 2018:  Mike Mccann is seen today for a recurrent episode of paroxysmal atrial fibrillation.  He had a  colonoscopy this morning and was noted to have atrial fibrillation during the procedure.  He was largely asymptomatic but he did feel a little lethargic from the procedure.  He went home and ate some chicken noodle soup.  He took several ECG readings with his [name] - he was definitely in atrial fibrillation.  He then later converted to normal sinus rhythm.  He is feeling well now.  Has any syncope or presyncope  April 06, 2019: Mike Mccann is seen today for follow-up of his paroxysmal atrial fibrillation. Has been having more PAF with vigorous  Exercise  ( tennis )  Takes a 1/4 propranolol when he has these palpitations.   A whole propranolol will cause extreme fatigue .  Also takes metoprolol 1/4 of a 25 mg tab before playing tennis which seems to help  Has been swimming and cycling more  Verifies his PAF with Kardia.  Occurs 4-5 times a month  Occasionally will wake up with atrial fib.    Episodes will last an hour.    Past Medical History:  Diagnosis Date  . Abnormal finding on EKG October 2013    a) 2-D Echo, Mod RA& mild LA dilation, RVSP 33 mmHg. Nl LV function - EF > 55%. No valvular dz;; b) Myoview: ~ mild basal-mid inferoseptal ischemia: A low risk scan with excellent exercise capacity of 15 METS, exercising for 13 min, but horizontal ST depression in Lat leads;; c) repeat Myoview 02/2013: LOW RISK study w/ diaphragmatic attenuation   . Arthritis    hip  . Complication of anesthesia    given more medications than usual-"unsure if that happend"  . Dyslipidemia, goal LDL below 130    On statin, "ratio is really off"  . GERD (gastroesophageal reflux disease)   . Paroxysmal atrial fibrillation (HCC)   . Tachycardia     Past Surgical History:  Procedure Laterality Date  . CARDIAC CATHETERIZATION N/A 02/27/2016   Procedure: Left Heart Cath and Coronary Angiography;  Surgeon: [name], MD;  Location: [facility] INVASIVE CV LAB;  Service: Cardiovascular;  Laterality: N/A;  .  CARDIAC CATHETERIZATION N/A 02/27/2016   Procedure: Intravascular Ultrasound/IVUS;  Surgeon: [name], MD;  Location: [facility] INVASIVE CV LAB;  Service: Cardiovascular;  Laterality: N/A;  . COLONOSCOPY  2 years ago  . TOTAL HIP ARTHROPLASTY Left 04/04/2014   Procedure: LEFT TOTAL HIP ARTHROPLASTY ANTERIOR APPROACH;  Surgeon: [name], MD;  Location: [facility];  Service: Orthopedics;  Laterality: Left;  . TOTAL HIP ARTHROPLASTY Right 05/02/2014   Procedure: TOTAL RIGHT HIP ARTHROPLASTY ANTERIOR APPROACH;  Surgeon: [name], MD;  Location: [facility];  Service: Orthopedics;  Laterality: Right;  .  VASECTOMY  8 years ago  . WISDOM TOOTH EXTRACTION  10 years ago    Current Outpatient Medications  Medication Sig Dispense Refill  . Cyanocobalamin (VITAMIN B-12 PO) Take 1 tablet by mouth daily.    [name] MAGNESIUM PO Take 1 tablet by mouth daily.    . metoprolol succinate (TOPROL-XL) 25 MG 24 hr tablet Take 6.25 mg by mouth as needed.    . Multiple Vitamins-Minerals (MULTIVITAMIN ADULT PO) Take 1 tablet by mouth daily.    [name] oxymetazoline (AFRIN 12 HOUR) 0.05 % nasal spray Place 1 spray into both nostrils 2 (two) times daily as needed for congestion.    . propranolol (INDERAL) 10 MG tablet Take 1 tablet (10 mg total) by mouth 4 (four) times daily as needed (Heart rate control). (Patient taking differently: Take 2.5 mg by mouth 4 (four) times daily as needed (Heart rate control). ) 120 tablet 11  . simvastatin (ZOCOR) 40 MG tablet Take 40 mg by mouth daily.   2   No current facility-administered medications for this visit.     Allergies:   Patient has no known allergies.    Social History:  The patient  reports that he has never smoked. He has never used smokeless tobacco. He reports current alcohol use. He reports that he does not use drugs.   Family History:  The patient's family history includes Heart attack in his maternal grandfather and maternal uncle.    ROS:  Please see the history of  present illness. All other systems are reviewed and negative.    Physical Exam: Blood pressure 112/80, pulse (!) 51, height 6' (1.829 m), weight 158 lb 12.8 oz (72 kg), SpO2 98 %.  GEN:  Well nourished, well developed in no acute distress HEENT: Normal NECK: No JVD; No carotid bruits LYMPHATICS: No lymphadenopathy CARDIAC: RRR,  HR is slow  RESPIRATORY:  Clear to auscultation without rales, wheezing or rhonchi  ABDOMEN: Soft, non-tender, non-distended MUSCULOSKELETAL:  No edema; No deformity  SKIN: Warm and dry NEUROLOGIC:  Alert and oriented x 3   EKG:    April 06, 2019: Sinus bradycardia 51 beats minute.  Possible LAE.  Recent Labs: No results found for requested labs within last 8760 hours.    Lipid Panel No results found for: CHOL, TRIG, HDL, CHOLHDL, VLDL, LDLCALC, LDLDIRECT   Wt Readings from Last 3 Encounters:  04/06/19 158 lb 12.8 oz (72 kg)  08/20/18 152 lb (68.9 kg)  03/09/18 155 lb (70.3 kg)     Other studies Reviewed: Additional studies/ records that were reviewed today include: Office notes and testing.  ASSESSMENT AND PLAN:  1.  Atrial fibrillation: He is having episodes of paroxysmal atrial fibrillation.  His atrial fibrillation seems to be little bit more pronounced.  He is having A. fib at least 2 or 3 times a week typically after playing tennis or other exercise.  He takes propranolol tablet which typically helps resolve the atrial fibrillation in about an hour.  He otherwise does not have any symptoms.  There is no chest pain or syncope..  We will discuss with Dr. Rayann Heman.  He may be a good candidate for atrial fibrillation since this is early in his disease state.  I will plan on seeing him again in 1 year.[name]   CHADS2VSC is 0   - is on  ASA 81 Some Xarelto on hand and propranolol on hand if needed.   2. Anticoagulation: CHA2DS2VASc=0,     Mertie Moores, MD  04/06/2019  10:12 AM    [facility] [address],  [address]  [place], [place]  [address] Pager [phone] Phone: [phone]; Fax: [phone]

## 2019-04-11 ENCOUNTER — Telehealth (INDEPENDENT_AMBULATORY_CARE_PROVIDER_SITE_OTHER): Payer: Federal, State, Local not specified - PPO | Admitting: Internal Medicine

## 2019-04-11 ENCOUNTER — Other Ambulatory Visit: Payer: Self-pay

## 2019-04-11 ENCOUNTER — Encounter: Payer: Self-pay | Admitting: Internal Medicine

## 2019-04-11 VITALS — BP 125/79 | HR 52 | Ht 72.0 in | Wt 155.0 lb

## 2019-04-11 DIAGNOSIS — I480 Paroxysmal atrial fibrillation: Secondary | ICD-10-CM

## 2019-04-11 NOTE — Patient Instructions (Signed)
Medication Instructions:  Your physician recommends that you continue on your current medications as directed. Please refer to the Current Medication list given to you today.  *If you need a refill on your cardiac medications before your next appointment, please call your pharmacy*  Lab Work: None If you have labs (blood work) drawn today and your tests are completely normal, you will receive your results only by: [name] MyChart Message (if you have MyChart) OR . A paper copy in the mail If you have any lab test that is abnormal or we need to change your treatment, we will call you to review the results.  Testing/Procedures: Someone will call you to schedule.. Your physician has requested that you have an echocardiogram. Echocardiography is a painless test that uses sound waves to create images of your heart. It provides your doctor with information about the size and shape of your heart and how well your heart's chambers and valves are working. This procedure takes approximately one hour. There are no restrictions for this procedure.    Follow-Up: At [facility], you and your health needs are our priority.  As part of our continuing mission to provide you with exceptional heart care, we have created designated Provider Care Teams.  These Care Teams include your primary Cardiologist (physician) and Advanced Practice Providers (APPs -  Physician Assistants and Nurse Practitioners) who all work together to provide you with the care you need, when you need it.  Your next appointment:   6 month(s)  The format for your next appointment:   In Person at the AFIB clinic   Provider:   Roderic Palau NP   Other Instructions

## 2019-04-11 NOTE — Progress Notes (Signed)
Electrophysiology TeleHealth Note   Due to national recommendations of social distancing due to COVID 19, Audio/video telehealth visit is felt to be most appropriate for this patient at this time.  See MyChart message from today for patient consent regarding telehealth for [facility].   Date:  04/11/2019   ID:  Mike BrownsJames P Brumm, DOB 08/21/1960, MRN [id number]  Location: home Provider location: [address] Evaluation Performed: New patient consult  PCP:  Rodrigo RanPerini, Mark, MD  Cardiologist:  Kristeen MissPhilip Nahser, MD  Electrophysiologist:  None   Chief Complaint:  afib  History of Present Illness:    Mike Mccann is a 58 y.o. male who presents via audio/video conferencing for a telehealth visit today.   The patient is referred for new consultation regarding afib by Dr Elease HashimotoNahser.  He was seen by me over 3 years ago after presenting in 2017 with symptoms of papitations.  At that time, he was having afib about once per week.  I advised ablation at that time.  Of note, he had a history of syncope which was worrisome for post termination pauses.  He did not have ablation and has been followed by Dr Elease HashimotoNahser since that time along with the AF clinic. He has been taking prn propranolol for his afib.  Episodes now occur about 4-5 times per month, typically lasting for an hour.  He feels that his episodes are exercise induced.  His afib occurs during his most vigorous exercise.  Today, he denies symptoms of chest pain, shortness of breath, orthopnea, PND, lower extremity edema, claudication, dizziness, presyncope, syncope, bleeding, or neurologic sequela. The patient is tolerating medications without difficulties and is otherwise without complaint today.   he denies symptoms of cough, fevers, chills, or new SOB worrisome for COVID 19.   Past Medical History:  Diagnosis Date  . Abnormal finding on EKG October 2013    a) 2-D Echo, Mod RA& mild LA dilation, RVSP 33 mmHg. Nl LV function - EF > 55%. No valvular  dz;; b) Myoview: ~ mild basal-mid inferoseptal ischemia: A low risk scan with excellent exercise capacity of 15 METS, exercising for 13 min, but horizontal ST depression in Lat leads;; c) repeat Myoview 02/2013: LOW RISK study w/ diaphragmatic attenuation   . Arthritis    hip  . Complication of anesthesia    given more medications than usual-"unsure if that happend"  . Dyslipidemia, goal LDL below 130    On statin, "ratio is really off"  . GERD (gastroesophageal reflux disease)   . Paroxysmal atrial fibrillation [facility](H[facility])     Past Surgical History:  Procedure Laterality Date  . CARDIAC CATHETERIZATION N/A 02/27/2016   Procedure: Left Heart Cath and Coronary Angiography;  Surgeon: Yvonne Kendallhristopher End, MD;  [facility];  Service: Cardiovascular;  Laterality: N/A;  . CARDIAC CATHETERIZATION N/A 02/27/2016   Procedure: Intravascular Ultrasound/IVUS;  Surgeon: Yvonne Kendallhristopher End, MD;  [facility]: [facility] INV[facility] CV LAB;  Service: Cardiovascular;  Laterality: N/A;  . COLONOSCOPY  2 years ago  . TOTAL HIP ARTHROPLASTY Left 04/04/2014   Procedure: LEFT TOTAL HIP ARTHROPLASTY ANTERIOR APPROACH;  Surgeon: Shelda PalMatthew D Olin, MD;  Location: [facility] ORS;  Service: Orthopedics;  Laterality: Left;  . TOTAL HIP ARTHROPLASTY Right 05/02/2014   Procedure: TOTAL RIGHT HIP ARTHROPLASTY ANTERIOR APPROACH;  Surgeon: Shelda PalMatthew D Olin, MD;  Location: [facility]S;  Service: Orthopedics;  Laterality: Right;  [name]. VASECTOMY  8 years ago  . WISDOM TOOTH EXTRACTION  10 years ago  Current Outpatient Medications  Medication Sig Dispense Refill  . Cyanocobalamin (VITAMIN B-12 PO) Take 1 tablet by mouth daily.    [name] MAGNESIUM PO Take 1 tablet by mouth daily.    . metoprolol succinate (TOPROL-XL) 25 MG 24 hr tablet Take 6.25 mg by mouth as needed.    . Multiple Vitamins-Minerals (MULTIVITAMIN ADULT PO) Take 1 tablet by mouth daily.    [name] oxymetazoline (AFRIN 12 HOUR) 0.05 % nasal spray Place 1 spray into both nostrils 2 (two) times  daily as needed for congestion.    . propranolol (INDERAL) 10 MG tablet Take 1 tablet (10 mg total) by mouth 4 (four) times daily as needed (Heart rate control). (Patient taking differently: Take 2.5 mg by mouth 4 (four) times daily as needed (Heart rate control). ) 120 tablet 11  . simvastatin (ZOCOR) 40 MG tablet Take 40 mg by mouth daily.   2   No current facility-administered medications for this visit.     Allergies:   Patient has no known allergies.   Social History:  The patient  reports that he has never smoked. He has never used smokeless tobacco. He reports current alcohol use. He reports that he does not use drugs.   Family History:  The patient's family history includes Heart attack in his maternal grandfather and maternal uncle.    ROS:  Please see the history of present illness.   All other systems are personally reviewed and negative.    Exam:    Vital Signs:  BP 125/79   Pulse (!) 52   Ht 6' (1.829 m)   Wt 155 lb (70.3 kg)   BMI 21.02 kg/m    Well sounding, alert and conversant    Labs/Other Tests and Data Reviewed:    Recent Labs: No results found for requested labs within last 8760 hours.   Wt Readings from Last 3 Encounters:  04/11/19 155 lb (70.3 kg)  04/06/19 158 lb 12.8 oz (72 kg)  08/20/18 152 lb (68.9 kg)     Other studies personally reviewed: Additional studies/ records that were reviewed today include: My prior notes,  Dr Lanny Hurst notes, echo from 2017  Review of the above records today demonstrates: as above  ekg 04/06/2019- sinus bradycardia 50 bpm  ASSESSMENT & PLAN:    1.  Paroxysmal atrial fibrillation The patient has symptomatic, recurrent paroxysmal atrial fibrillation. Medical therapy has been limited by bradycardia. Chads2vasc score is 0 and he does not require Draper therapy. Therapeutic strategies for afib including medicine (flacainide, tikosyn) and primary ablation were discussed in detail with the patient today. Risk, benefits,  and alternatives to EP study and radiofrequency ablation for afib were also discussed in detail today. These risks include but are not limited to stroke, bleeding, vascular damage, tamponade, perforation, damage to the esophagus, lungs, and other structures, pulmonary vein stenosis, worsening renal function, and death.  We also talked about possible adverse impact on exercise tolerance as well as elevation in heart rates as a result of ablation. The patient understands these risk and wishes to avoid ablation at this time. If his afib worsens then he may be more willing to consider  Obtain 2D echo to evaluate for effects of his afib   Follow-up:  6 months  Current medicines are reviewed at length with the patient today.   The patient does not have concerns regarding his medicines.  The following changes were made today:  none  Labs/ tests ordered today include:  No orders of  the defined types were placed in this encounter.   Patient Risk:  after full review of this patients clinical status, I feel that they are at moderate risk at this time.   Today, I have spent 20 minutes with the patient with telehealth technology discussing afib .    Signed, Hillis Range MD, [facility] 04/11/2019 3:15 PM   [facility] [address] [place] [address] [phone] (office) [phone] (fax)

## 2019-04-20 ENCOUNTER — Other Ambulatory Visit (HOSPITAL_COMMUNITY): Payer: Federal, State, Local not specified - PPO

## 2019-05-05 DIAGNOSIS — Z20818 Contact with and (suspected) exposure to other bacterial communicable diseases: Secondary | ICD-10-CM | POA: Diagnosis not present

## 2019-05-05 DIAGNOSIS — E7849 Other hyperlipidemia: Secondary | ICD-10-CM | POA: Diagnosis not present

## 2019-05-05 DIAGNOSIS — Z125 Encounter for screening for malignant neoplasm of prostate: Secondary | ICD-10-CM | POA: Diagnosis not present

## 2019-05-05 DIAGNOSIS — R946 Abnormal results of thyroid function studies: Secondary | ICD-10-CM | POA: Diagnosis not present

## 2019-05-09 DIAGNOSIS — R82998 Other abnormal findings in urine: Secondary | ICD-10-CM | POA: Diagnosis not present

## 2019-05-10 ENCOUNTER — Ambulatory Visit (HOSPITAL_COMMUNITY): Payer: Federal, State, Local not specified - PPO | Attending: Cardiology

## 2019-05-10 ENCOUNTER — Other Ambulatory Visit: Payer: Self-pay

## 2019-05-10 DIAGNOSIS — I480 Paroxysmal atrial fibrillation: Secondary | ICD-10-CM | POA: Diagnosis not present

## 2019-05-10 DIAGNOSIS — Z1212 Encounter for screening for malignant neoplasm of rectum: Secondary | ICD-10-CM | POA: Diagnosis not present

## 2019-05-13 DIAGNOSIS — I7300 Raynaud's syndrome without gangrene: Secondary | ICD-10-CM | POA: Diagnosis not present

## 2019-05-13 DIAGNOSIS — Z1389 Encounter for screening for other disorder: Secondary | ICD-10-CM | POA: Diagnosis not present

## 2019-05-13 DIAGNOSIS — Z0000 Encounter for general adult medical examination without abnormal findings: Secondary | ICD-10-CM | POA: Diagnosis not present

## 2019-05-13 DIAGNOSIS — I480 Paroxysmal atrial fibrillation: Secondary | ICD-10-CM | POA: Diagnosis not present

## 2019-05-13 DIAGNOSIS — D7589 Other specified diseases of blood and blood-forming organs: Secondary | ICD-10-CM | POA: Diagnosis not present

## 2019-05-13 DIAGNOSIS — R9431 Abnormal electrocardiogram [ECG] [EKG]: Secondary | ICD-10-CM | POA: Diagnosis not present

## 2019-05-19 DIAGNOSIS — R040 Epistaxis: Secondary | ICD-10-CM | POA: Diagnosis not present

## 2019-07-11 ENCOUNTER — Telehealth: Payer: Self-pay

## 2019-07-11 ENCOUNTER — Encounter: Payer: Self-pay | Admitting: Internal Medicine

## 2019-07-11 ENCOUNTER — Telehealth (INDEPENDENT_AMBULATORY_CARE_PROVIDER_SITE_OTHER): Payer: Federal, State, Local not specified - PPO | Admitting: Internal Medicine

## 2019-07-11 VITALS — BP 128/83 | HR 59 | Ht 72.0 in | Wt 158.0 lb

## 2019-07-11 DIAGNOSIS — I480 Paroxysmal atrial fibrillation: Secondary | ICD-10-CM

## 2019-07-11 DIAGNOSIS — I4891 Unspecified atrial fibrillation: Secondary | ICD-10-CM

## 2019-07-11 MED ORDER — RIVAROXABAN 20 MG PO TABS: 20.0000 mg | ORAL_TABLET | Freq: Every day | ORAL | 11 refills | Status: DC

## 2019-07-11 NOTE — Telephone Encounter (Signed)
-----   Message from Hillis Range, MD sent at 07/11/2019  9:01 AM EST ----- Afib ablation  C/I/A  Cardiac CT  Start xarelto and then proceed with ablation 3 weeks later.

## 2019-07-11 NOTE — Progress Notes (Signed)
Electrophysiology TeleHealth Note   Due to national recommendations of social distancing due to COVID 19, an audio/video telehealth visit is felt to be most appropriate for this patient at this time.  See MyChart message from today for the patient's consent to telehealth for [name]  Date:  07/11/2019   ID:  Mike Mccann, DOB [birth year], MRN [id number]  Location: patient's home  Provider location:  [facility]  Evaluation Performed: Follow-up visit  PCP:  Rodrigo Ran, MD   Electrophysiologist:  Dr Johney Frame  Chief Complaint:  palpitations  History of Present Illness:    Mike Mccann is a 59 y.o. male who presents via telehealth conferencing today.  Since last being seen in our clinic, the patient reports doing very well.  His afib burden has increased from 3-4 times per month to several times per week.  + palpitations and fatigue.  He wakes from sleep sometimes with afib.  Also notices increased afib with ecercise. Today, he denies symptoms of palpitations, chest pain, shortness of breath,  lower extremity edema, dizziness, presyncope, or syncope.  The patient is otherwise without complaint today.  The patient denies symptoms of fevers, chills, cough, or new SOB worrisome for COVID 19.  Past Medical History:  Diagnosis Date  . Abnormal finding on EKG October 2013    a) 2-D Echo, Mod RA& mild LA dilation, RVSP 33 mmHg. Nl LV function - EF > 55%. No valvular dz;; b) Myoview: ~ mild basal-mid inferoseptal ischemia: A low risk scan with excellent exercise capacity of 15 METS, exercising for 13 min, but horizontal ST depression in Lat leads;; c) repeat Myoview 02/2013: LOW RISK study w/ diaphragmatic attenuation   . Arthritis    hip  . Complication of anesthesia    given more medications than usual-"unsure if that happend"  . Dyslipidemia, goal LDL below 130    On statin, "ratio is really off"  . GERD (gastroesophageal reflux disease)   . Paroxysmal [facility] fibrillation [facility])      Past Surgical History:  Procedure Laterality Date  . CARDIAC CATHETERIZATION N/A 02/27/2016   Procedure: Left Heart Cath and Coronary Angiography;  Surgeon: [name], MD;  [facility] CV LAB;  Service: Cardiovascular;  Laterality: N/A;  . CARDIAC CATHETERIZATION N/A 02/27/2016   Procedure: Intravascular Ultrasound/IVUS;  Surgeon: [name], MD;  [facility]: [facility] CV LAB;  Service: Cardiovascular;  Laterality: N/A;  . COLONOSCOPY  2 years ago  . TOTAL HIP ARTHROPLASTY Left 04/04/2014   Procedure: LEFT TOTAL HIP ARTHROPLASTY ANTERIOR APPROACH;  Surgeon: [name], MD;  Location: [facility];  Service: Orthopedics;  Laterality: Left;  . TOTAL HIP ARTHROPLASTY Right 05/02/2014   Procedure: TOTAL RIGHT HIP ARTHROPLASTY ANTERIOR APPROACH;  Surgeon: [name], MD;  Location: [facility];  Service: Orthopedics;  Laterality: Right;  [name] VASECTOMY  8 years ago  . WISDOM TOOTH EXTRACTION  10 years ago    Current Outpatient Medications  Medication Sig Dispense Refill  . Cyanocobalamin (VITAMIN B-12 PO) Take 1 tablet by mouth daily.    [name] MAGNESIUM PO Take 1 tablet by mouth daily.    . metoprolol succinate (TOPROL-XL) 25 MG 24 hr tablet Take 6.25 mg by mouth as needed.    . Multiple Vitamins-Minerals (MULTIVITAMIN ADULT PO) Take 1 tablet by mouth daily.    [name] oxymetazoline (AFRIN 12 HOUR) 0.05 % nasal spray Place 1 spray into both nostrils 2 (two) times daily as needed for congestion.    [name]  propranolol (INDERAL) 10 MG tablet Take 1 tablet (10 mg total) by mouth 4 (four) times daily as needed (Heart rate control). 120 tablet 11  . simvastatin (ZOCOR) 40 MG tablet Take 40 mg by mouth daily.   2   No current facility-administered medications for this visit.    Allergies:   Patient has no known allergies.   Social History:  The patient  reports that he has never smoked. He has never used smokeless tobacco. He reports current alcohol use. He reports that he does not use drugs.     Family History:  The patient's family history includes Heart attack in his maternal grandfather and maternal uncle.   ROS:  Please see the history of present illness.   All other systems are personally reviewed and negative.    Exam:    Vital Signs:  BP 128/83   Pulse (!) 59   Ht 6' (1.829 m)   Wt 158 lb (71.7 kg)   BMI 21.43 kg/m   Well sounding and appearing, alert and conversant, regular work of breathing,  good skin color Eyes- anicteric, neuro- grossly intact, skin- no apparent rash or lesions or cyanosis, mouth- oral mucosa is pink  Labs/Other Tests and Data Reviewed:    Recent Labs: No results found for requested labs within last 8760 hours.   Wt Readings from Last 3 Encounters:  07/11/19 158 lb (71.7 kg)  04/11/19 155 lb (70.3 kg)  04/06/19 158 lb 12.8 oz (72 kg)      ASSESSMENT & PLAN:    1.  Paroxysmal atrial fibrillation The patient has symptomatic, recurrent paroxysmal atrial fibrillation. he has failed medical therapy with beta blocker therapy. Chads2vasc score is 0.   Therapeutic strategies for afib including medicine and ablation were discussed in detail with the patient today. Risk, benefits, and alternatives to EP study and radiofrequency ablation for afib were also discussed in detail today. These risks include but are not limited to stroke, bleeding, vascular damage, tamponade, perforation, damage to the esophagus, lungs, and other structures, pulmonary vein stenosis, worsening renal function, and death. The patient understands these risk and wishes to proceed.  We will therefore proceed with catheter ablation at the next available time.  Carto, ICE, anesthesia are requested for the procedure.  Will also obtain cardiac CT prior to the procedure to exclude LAA thrombus and further evaluate atrial anatomy.  Start xarelto 20mg  daily at this time.  Proceed with ablation after he has been anticoagulated for 3 weeks    Patient Risk:  after full review of this  patients clinical status, I feel that they are at moderate risk at this time.  Today, I have spent 35 minutes with the patient with telehealth technology discussing arrhythmia management .    Army Fossa, MD  07/11/2019 8:34 AM     [facility] [address] [place] [address] [phone] (office) [phone] (fax)

## 2019-07-15 NOTE — Telephone Encounter (Signed)
Pt is scheduled for afib ablation on April 13  All pretests scheduled.  Work up complete.

## 2019-07-18 ENCOUNTER — Telehealth: Payer: Federal, State, Local not specified - PPO | Admitting: Internal Medicine

## 2019-07-22 ENCOUNTER — Other Ambulatory Visit: Payer: Self-pay | Admitting: Cardiovascular Disease

## 2019-07-22 MED ORDER — METOPROLOL SUCCINATE ER 25 MG PO TB24: ORAL_TABLET | ORAL | 6 refills | Status: DC

## 2019-08-02 ENCOUNTER — Other Ambulatory Visit: Payer: Self-pay

## 2019-08-02 ENCOUNTER — Other Ambulatory Visit: Payer: Federal, State, Local not specified - PPO

## 2019-08-02 DIAGNOSIS — I480 Paroxysmal atrial fibrillation: Secondary | ICD-10-CM

## 2019-08-02 DIAGNOSIS — I4891 Unspecified atrial fibrillation: Secondary | ICD-10-CM

## 2019-08-02 LAB — BASIC METABOLIC PANEL
BUN/Creatinine Ratio: 15 (ref 9–20)
BUN: 15 mg/dL (ref 6–24)
CO2: 27 mmol/L (ref 20–29)
Calcium: 9.5 mg/dL (ref 8.7–10.2)
Chloride: 102 mmol/L (ref 96–106)
Creatinine, Ser: 1.02 mg/dL (ref 0.76–1.27)
GFR calc Af Amer: 93 mL/min/{1.73_m2} (ref >59)
GFR calc non Af Amer: 80 mL/min/{1.73_m2} (ref >59)
Glucose: 95 mg/dL (ref 65–99)
Potassium: 4.9 mmol/L (ref 3.5–5.2)
Sodium: 135 mmol/L (ref 134–144)

## 2019-08-02 LAB — CBC WITH DIFFERENTIAL/PLATELET
Basophils Absolute: 0 10*3/uL (ref 0.0–0.2)
Basos: 1 %
EOS (ABSOLUTE): 0.2 10*3/uL (ref 0.0–0.4)
Eos: 4 %
Hematocrit: 43.8 % (ref 37.5–51.0)
Hemoglobin: 15.1 g/dL (ref 13.0–17.7)
Lymphocytes Absolute: 1.5 10*3/uL (ref 0.7–3.1)
Lymphs: 35 %
MCH: 32.7 pg (ref 26.6–33.0)
MCHC: 34.5 g/dL (ref 31.5–35.7)
MCV: 95 fL (ref 79–97)
Monocytes Absolute: 0.7 10*3/uL (ref 0.1–0.9)
Monocytes: 17 %
Neutrophils Absolute: 1.9 10*3/uL (ref 1.4–7.0)
Neutrophils: 43 %
Platelets: 198 10*3/uL (ref 150–450)
RBC: 4.62 x10E6/uL (ref 4.14–5.80)
RDW: 12.7 % (ref 11.6–15.4)
WBC: 4.3 10*3/uL (ref 3.4–10.8)

## 2019-08-11 ENCOUNTER — Encounter (HOSPITAL_COMMUNITY): Payer: Self-pay

## 2019-08-11 ENCOUNTER — Telehealth (HOSPITAL_COMMUNITY): Payer: Self-pay | Admitting: Emergency Medicine

## 2019-08-11 NOTE — Telephone Encounter (Signed)
Reaching out to patient to offer assistance regarding upcoming cardiac imaging study; pt verbalizes understanding of appt date/time, parking situation and where to check in, pre-test NPO status and medications ordered, and verified current allergies; name and call back number provided for further questions should they arise Navraj Dreibelbis RN Navigator Cardiac Imaging [facility] [phone] office [phone] cell 

## 2019-08-12 ENCOUNTER — Telehealth (INDEPENDENT_AMBULATORY_CARE_PROVIDER_SITE_OTHER): Payer: Federal, State, Local not specified - PPO | Admitting: Internal Medicine

## 2019-08-12 ENCOUNTER — Other Ambulatory Visit: Payer: Self-pay

## 2019-08-12 ENCOUNTER — Encounter: Payer: Self-pay | Admitting: Internal Medicine

## 2019-08-12 ENCOUNTER — Ambulatory Visit (HOSPITAL_COMMUNITY)
Admission: RE | Admit: 2019-08-12 | Discharge: 2019-08-12 | Disposition: A | Discharge status: Home / Self Care | Payer: Federal, State, Local not specified - PPO | Source: Ambulatory Visit | Attending: Internal Medicine | Admitting: Internal Medicine

## 2019-08-12 VITALS — Ht 72.0 in | Wt 155.0 lb

## 2019-08-12 DIAGNOSIS — I480 Paroxysmal atrial fibrillation: Secondary | ICD-10-CM | POA: Diagnosis not present

## 2019-08-12 DIAGNOSIS — I4891 Unspecified atrial fibrillation: Secondary | ICD-10-CM | POA: Insufficient documentation

## 2019-08-12 MED ORDER — IOHEXOL 350 MG/ML SOLN
80.0000 mL | Freq: Once | INTRAVENOUS | Status: AC | PRN
  Administered 2019-08-12: 80 mL via INTRAVENOUS

## 2019-08-12 NOTE — Progress Notes (Signed)
Electrophysiology TeleHealth Note   Due to national recommendations of social distancing due to COVID 19, an audio/video telehealth visit is felt to be most appropriate for this patient at this time.  See MyChart message from today for the patient's consent to telehealth for [facility].  Date:  08/12/2019   ID:  Mike Mccann, DOB 11/06/1960, MRN [id number]  Location: patient's home  Provider location:  [facility]  Evaluation Performed: Follow-up visit  PCP:  Rodrigo Ran, MD   Electrophysiologist:  Dr Johney Frame  Chief Complaint:  palpitations  History of Present Illness:    Mike Mccann is a 58 y.o. male who presents via telehealth conferencing today.  Since last being seen in our clinic, the patient reports doing very well. He is nervous about his ablation next week.  He continues to have afib, though the past 2 weeks he has had none.  Today, he denies symptoms of palpitations, chest pain, shortness of breath,  lower extremity edema, dizziness, presyncope, or syncope.  The patient is otherwise without complaint today.  The patient denies symptoms of fevers, chills, cough, or new SOB worrisome for COVID 19.  Past Medical History:  Diagnosis Date  . Abnormal finding on EKG October 2013    a) 2-D Echo, Mod RA& mild LA dilation, RVSP 33 mmHg. Nl LV function - EF > 55%. No valvular dz;; b) Myoview: ~ mild basal-mid inferoseptal ischemia: A low risk scan with excellent exercise capacity of 15 METS, exercising for 13 min, but horizontal ST depression in Lat leads;; c) repeat Myoview 02/2013: LOW RISK study w/ diaphragmatic attenuation   . Arthritis    hip  . Complication of anesthesia    given more medications than usual-"unsure if that happend"  . Dyslipidemia, goal LDL below 130    On statin, "ratio is really off"  . GERD (gastroesophageal reflux disease)   . Paroxysmal atrial fibrillation [facility])     Past Surgical History:  Procedure Laterality Date  . CARDIAC  CATHETERIZATION N/A 02/27/2016   Procedure: Left Heart Cath and Coronary Angiography;  Surgeon: [name], MD;  Location: [facility] INVASIVE CV LAB;  Service: Cardiovascular;  Laterality: N/A;  . CARDIAC CATHETERIZATION N/A 02/27/2016   Procedure: Intravascular Ultrasound/IVUS;  Surgeon: [name], MD;  Location: [facility] INVASIVE CV LAB;  Service: Cardiovascular;  Laterality: N/A;  . COLONOSCOPY  2 years ago  . TOTAL HIP ARTHROPLASTY Left 04/04/2014   Procedure: LEFT TOTAL HIP ARTHROPLASTY ANTERIOR APPROACH;  Surgeon: [name], MD;  Location: [facility];  Service: Orthopedics;  Laterality: Left;  . TOTAL HIP ARTHROPLASTY Right 05/02/2014   Procedure: TOTAL RIGHT HIP ARTHROPLASTY ANTERIOR APPROACH;  Surgeon: [name], MD;  Location: [facility];  Service: Orthopedics;  Laterality: Right;  [name] VASECTOMY  8 years ago  . WISDOM TOOTH EXTRACTION  10 years ago    Current Outpatient Medications  Medication Sig Dispense Refill  . MAGNESIUM PO Take 1 tablet by mouth daily.    . metoprolol succinate (TOPROL-XL) 25 MG 24 hr tablet Take 6.25 tablet (1/4 tablet) by mouth daily as needed. 15 tablet 6  . Multiple Vitamins-Minerals (MULTIVITAMIN ADULT PO) Take 1 tablet by mouth daily.    . propranolol (INDERAL) 10 MG tablet Take 1 tablet (10 mg total) by mouth 4 (four) times daily as needed (Heart rate control). 120 tablet 11  . rivaroxaban (XARELTO) 20 MG TABS tablet Take 1 tablet (20 mg total) by mouth daily with supper. 30 tablet 11  .  simvastatin (ZOCOR) 40 MG tablet Take 40 mg by mouth daily.   2  . sodium chloride (OCEAN) 0.65 % SOLN nasal spray Place 1 spray into both nostrils daily.    . vitamin B-12 (CYANOCOBALAMIN) 1000 MCG tablet Take 1,000 mcg by mouth daily.      No current facility-administered medications for this visit.    Allergies:   Patient has no known allergies.   Social History:  The patient  reports that he has never smoked. He has never used smokeless tobacco. He reports current  alcohol use. He reports that he does not use drugs.   ROS:  Please see the history of present illness.   All other systems are personally reviewed and negative.    Exam:    Vital Signs:  Ht 6' (1.829 m)   Wt 155 lb (70.3 kg)   BMI 21.02 kg/m   Well sounding and appearing, alert and conversant, regular work of breathing,  good skin color Eyes- anicteric, neuro- grossly intact, skin- no apparent rash or lesions or cyanosis, mouth- oral mucosa is pink  Labs/Other Tests and Data Reviewed:    Recent Labs: 08/02/2019: BUN 15; Creatinine, Ser 1.02; Hemoglobin 15.1; Platelets 198; Potassium 4.9; Sodium 135   Wt Readings from Last 3 Encounters:  08/12/19 155 lb (70.3 kg)  07/11/19 158 lb (71.7 kg)  04/11/19 155 lb (70.3 kg)       ASSESSMENT & PLAN:    1.  Paroxysmal atrial fibrillation The patient has symptomatic, recurrent atrial fibrillation. he has failed medical therapy with beta blocker therapy. Chads2vasc score is 0.  he is anticoagulated with xarelto and doing well preparation for his ablation. . Risk, benefits, and alternatives to EP study and radiofrequency ablation for afib were again discussed in detail today. These risks include but are not limited to stroke, bleeding, vascular damage, tamponade, perforation, damage to the esophagus, lungs, and other structures, pulmonary vein stenosis, worsening renal function, and death.  We also discussed that some patients who are chronically bradycardic may experience an increase in their heart rate.  Some patients do not like this sensation but it typically resolves with time.  The patient understands these risk and wishes to proceed.       Patient Risk:  after full review of this patients clinical status, I feel that they are at moderate risk at this time.  Today, I have spent 15 minutes with the patient with telehealth technology discussing arrhythmia management .    Signed, Edward Clydia Nieves, MD  08/12/2019 4:21 PM     [facility] [address] [place] [address] [phone] (office) [phone] (fax)  

## 2019-08-12 NOTE — H&P (View-Only) (Signed)
Electrophysiology TeleHealth Note   Due to national recommendations of social distancing due to COVID 19, an audio/video telehealth visit is felt to be most appropriate for this patient at this time.  See MyChart message from today for the patient's consent to telehealth for [facility].  Date:  08/12/2019   ID:  Mike Mccann, DOB 11/06/1960, MRN [id number]  Location: patient's home  Provider location:  [facility]  Evaluation Performed: Follow-up visit  PCP:  Rodrigo Ran, MD   Electrophysiologist:  Dr Johney Frame  Chief Complaint:  palpitations  History of Present Illness:    Mike Mccann is a 59 y.o. male who presents via telehealth conferencing today.  Since last being seen in our clinic, the patient reports doing very well. He is nervous about his ablation next week.  He continues to have afib, though the past 2 weeks he has had none.  Today, he denies symptoms of palpitations, chest pain, shortness of breath,  lower extremity edema, dizziness, presyncope, or syncope.  The patient is otherwise without complaint today.  The patient denies symptoms of fevers, chills, cough, or new SOB worrisome for COVID 19.  Past Medical History:  Diagnosis Date  . Abnormal finding on EKG October 2013    a) 2-D Echo, Mod RA& mild LA dilation, RVSP 33 mmHg. Nl LV function - EF > 55%. No valvular dz;; b) Myoview: ~ mild basal-mid inferoseptal ischemia: A low risk scan with excellent exercise capacity of 15 METS, exercising for 13 min, but horizontal ST depression in Lat leads;; c) repeat Myoview 02/2013: LOW RISK study w/ diaphragmatic attenuation   . Arthritis    hip  . Complication of anesthesia    given more medications than usual-"unsure if that happend"  . Dyslipidemia, goal LDL below 130    On statin, "ratio is really off"  . GERD (gastroesophageal reflux disease)   . Paroxysmal atrial fibrillation [facility])     Past Surgical History:  Procedure Laterality Date  . CARDIAC  CATHETERIZATION N/A 02/27/2016   Procedure: Left Heart Cath and Coronary Angiography;  Surgeon: [name], MD;  Location: [facility] INVASIVE CV LAB;  Service: Cardiovascular;  Laterality: N/A;  . CARDIAC CATHETERIZATION N/A 02/27/2016   Procedure: Intravascular Ultrasound/IVUS;  Surgeon: [name], MD;  Location: [facility] INVASIVE CV LAB;  Service: Cardiovascular;  Laterality: N/A;  . COLONOSCOPY  2 years ago  . TOTAL HIP ARTHROPLASTY Left 04/04/2014   Procedure: LEFT TOTAL HIP ARTHROPLASTY ANTERIOR APPROACH;  Surgeon: [name], MD;  Location: [facility] ORS;  Service: Orthopedics;  Laterality: Left;  . TOTAL HIP ARTHROPLASTY Right 05/02/2014   Procedure: TOTAL RIGHT HIP ARTHROPLASTY ANTERIOR APPROACH;  Surgeon: [name], MD;  Location: [facility] ORS;  Service: Orthopedics;  Laterality: Right;  [name] VASECTOMY  8 years ago  . WISDOM TOOTH EXTRACTION  10 years ago    Current Outpatient Medications  Medication Sig Dispense Refill  . MAGNESIUM PO Take 1 tablet by mouth daily.    . metoprolol succinate (TOPROL-XL) 25 MG 24 hr tablet Take 6.25 tablet (1/4 tablet) by mouth daily as needed. 15 tablet 6  . Multiple Vitamins-Minerals (MULTIVITAMIN ADULT PO) Take 1 tablet by mouth daily.    . propranolol (INDERAL) 10 MG tablet Take 1 tablet (10 mg total) by mouth 4 (four) times daily as needed (Heart rate control). 120 tablet 11  . rivaroxaban (XARELTO) 20 MG TABS tablet Take 1 tablet (20 mg total) by mouth daily with supper. 30 tablet 11  .  simvastatin (ZOCOR) 40 MG tablet Take 40 mg by mouth daily.   2  . sodium chloride (OCEAN) 0.65 % SOLN nasal spray Place 1 spray into both nostrils daily.    . vitamin B-12 (CYANOCOBALAMIN) 1000 MCG tablet Take 1,000 mcg by mouth daily.      No current facility-administered medications for this visit.    Allergies:   Patient has no known allergies.   Social History:  The patient  reports that he has never smoked. He has never used smokeless tobacco. He reports current  alcohol use. He reports that he does not use drugs.   ROS:  Please see the history of present illness.   All other systems are personally reviewed and negative.    Exam:    Vital Signs:  Ht 6' (1.829 m)   Wt 155 lb (70.3 kg)   BMI 21.02 kg/m   Well sounding and appearing, alert and conversant, regular work of breathing,  good skin color Eyes- anicteric, neuro- grossly intact, skin- no apparent rash or lesions or cyanosis, mouth- oral mucosa is pink  Labs/Other Tests and Data Reviewed:    Recent Labs: 08/02/2019: BUN 15; Creatinine, Ser 1.02; Hemoglobin 15.1; Platelets 198; Potassium 4.9; Sodium 135   Wt Readings from Last 3 Encounters:  08/12/19 155 lb (70.3 kg)  07/11/19 158 lb (71.7 kg)  04/11/19 155 lb (70.3 kg)       ASSESSMENT & PLAN:    1.  Paroxysmal atrial fibrillation The patient has symptomatic, recurrent atrial fibrillation. he has failed medical therapy with beta blocker therapy. Chads2vasc score is 0.  he is anticoagulated with xarelto and doing well preparation for his ablation. . Risk, benefits, and alternatives to EP study and radiofrequency ablation for afib were again discussed in detail today. These risks include but are not limited to stroke, bleeding, vascular damage, tamponade, perforation, damage to the esophagus, lungs, and other structures, pulmonary vein stenosis, worsening renal function, and death.  We also discussed that some patients who are chronically bradycardic may experience an increase in their heart rate.  Some patients do not like this sensation but it typically resolves with time.  The patient understands these risk and wishes to proceed.       Patient Risk:  after full review of this patients clinical status, I feel that they are at moderate risk at this time.  Today, I have spent 15 minutes with the patient with telehealth technology discussing arrhythmia management .    SignedThompson Grayer, MD  08/12/2019 4:21 PM     [address] [place] [phone] (office) [phone] (fax)

## 2019-08-13 ENCOUNTER — Other Ambulatory Visit (HOSPITAL_COMMUNITY)
Admission: RE | Admit: 2019-08-13 | Discharge: 2019-08-13 | Disposition: A | Discharge status: Home / Self Care | Payer: Federal, State, Local not specified - PPO | Source: Ambulatory Visit | Attending: Internal Medicine | Admitting: Internal Medicine

## 2019-08-13 DIAGNOSIS — Z20822 Contact with and (suspected) exposure to covid-19: Secondary | ICD-10-CM | POA: Insufficient documentation

## 2019-08-13 DIAGNOSIS — Z01812 Encounter for preprocedural laboratory examination: Secondary | ICD-10-CM | POA: Diagnosis not present

## 2019-08-13 LAB — SARS CORONAVIRUS 2 (TAT 6-24 HRS): SARS Coronavirus 2: NEGATIVE

## 2019-08-16 ENCOUNTER — Ambulatory Visit (HOSPITAL_COMMUNITY): Payer: Federal, State, Local not specified - PPO

## 2019-08-16 ENCOUNTER — Encounter (HOSPITAL_COMMUNITY)
Admission: RE | Disposition: A | Discharge status: Home / Self Care | Payer: Self-pay | Source: Ambulatory Visit | Attending: Internal Medicine

## 2019-08-16 ENCOUNTER — Other Ambulatory Visit: Payer: Self-pay | Admitting: Internal Medicine

## 2019-08-16 ENCOUNTER — Other Ambulatory Visit: Payer: Self-pay

## 2019-08-16 ENCOUNTER — Ambulatory Visit (HOSPITAL_COMMUNITY)
Admission: RE | Admit: 2019-08-16 | Discharge: 2019-08-16 | Disposition: A | Discharge status: Home / Self Care | Payer: Federal, State, Local not specified - PPO | Source: Ambulatory Visit | Attending: Internal Medicine | Admitting: Internal Medicine

## 2019-08-16 DIAGNOSIS — Z7901 Long term (current) use of anticoagulants: Secondary | ICD-10-CM | POA: Diagnosis not present

## 2019-08-16 DIAGNOSIS — M1990 Unspecified osteoarthritis, unspecified site: Secondary | ICD-10-CM | POA: Diagnosis not present

## 2019-08-16 DIAGNOSIS — I480 Paroxysmal atrial fibrillation: Secondary | ICD-10-CM | POA: Insufficient documentation

## 2019-08-16 DIAGNOSIS — E785 Hyperlipidemia, unspecified: Secondary | ICD-10-CM | POA: Insufficient documentation

## 2019-08-16 DIAGNOSIS — K219 Gastro-esophageal reflux disease without esophagitis: Secondary | ICD-10-CM | POA: Insufficient documentation

## 2019-08-16 DIAGNOSIS — Z79899 Other long term (current) drug therapy: Secondary | ICD-10-CM | POA: Insufficient documentation

## 2019-08-16 HISTORY — PX: ATRIAL FIBRILLATION ABLATION: EP1191

## 2019-08-16 LAB — POCT ACTIVATED CLOTTING TIME: Activated Clotting Time: 241 seconds

## 2019-08-16 SURGERY — ATRIAL FIBRILLATION ABLATION
Anesthesia: General

## 2019-08-16 MED ORDER — ISOPROTERENOL HCL 0.2 MG/ML IJ SOLN
INTRAMUSCULAR | Status: AC
  Filled 2019-08-16: qty 5

## 2019-08-16 MED ORDER — SODIUM CHLORIDE 0.9% FLUSH: 3.0000 mL | INTRAVENOUS | Status: DC | PRN

## 2019-08-16 MED ORDER — SODIUM CHLORIDE 0.9 % IV SOLN: INTRAVENOUS | Status: DC | PRN

## 2019-08-16 MED ORDER — SODIUM CHLORIDE 0.9 % IV SOLN: INTRAVENOUS | Status: DC

## 2019-08-16 MED ORDER — HEPARIN SODIUM (PORCINE) 1000 UNIT/ML IJ SOLN
INTRAMUSCULAR | Status: AC
  Filled 2019-08-16: qty 2

## 2019-08-16 MED ORDER — ACETAMINOPHEN 325 MG PO TABS
650.0000 mg | ORAL_TABLET | ORAL | Status: DC | PRN
  Administered 2019-08-16: 650 mg via ORAL

## 2019-08-16 MED ORDER — ONDANSETRON HCL 4 MG/2ML IJ SOLN: 4.0000 mg | Freq: Four times a day (QID) | INTRAMUSCULAR | Status: DC | PRN

## 2019-08-16 MED ORDER — ROCURONIUM BROMIDE 50 MG/5ML IV SOSY
PREFILLED_SYRINGE | INTRAVENOUS | Status: DC | PRN
  Administered 2019-08-16: 10 mg via INTRAVENOUS
  Administered 2019-08-16: 70 mg via INTRAVENOUS

## 2019-08-16 MED ORDER — EPHEDRINE SULFATE 50 MG/ML IJ SOLN
INTRAMUSCULAR | Status: DC | PRN
  Administered 2019-08-16 (×2): 10 mg via INTRAVENOUS
  Administered 2019-08-16: 5 mg via INTRAVENOUS
  Administered 2019-08-16: 10 mg via INTRAVENOUS

## 2019-08-16 MED ORDER — ONDANSETRON HCL 4 MG/2ML IJ SOLN
INTRAMUSCULAR | Status: DC | PRN
  Administered 2019-08-16: 4 mg via INTRAVENOUS

## 2019-08-16 MED ORDER — LACTATED RINGERS IV SOLN: INTRAVENOUS | Status: DC | PRN

## 2019-08-16 MED ORDER — ACETAMINOPHEN 325 MG PO TABS
ORAL_TABLET | ORAL | Status: AC
  Filled 2019-08-16: qty 2

## 2019-08-16 MED ORDER — FENTANYL CITRATE (PF) 100 MCG/2ML IJ SOLN
INTRAMUSCULAR | Status: DC | PRN
  Administered 2019-08-16 (×2): 50 ug via INTRAVENOUS

## 2019-08-16 MED ORDER — PHENYLEPHRINE HCL-NACL 10-0.9 MG/250ML-% IV SOLN
INTRAVENOUS | Status: DC | PRN
  Administered 2019-08-16: 25 ug/min via INTRAVENOUS

## 2019-08-16 MED ORDER — HEPARIN SODIUM (PORCINE) 1000 UNIT/ML IJ SOLN
INTRAMUSCULAR | Status: DC | PRN
  Administered 2019-08-16: 1000 [IU] via INTRAVENOUS
  Administered 2019-08-16: 14000 [IU] via INTRAVENOUS

## 2019-08-16 MED ORDER — GLYCOPYRROLATE 0.2 MG/ML IJ SOLN
INTRAMUSCULAR | Status: DC | PRN
  Administered 2019-08-16: .1 mg via INTRAVENOUS
  Administered 2019-08-16: .2 mg via INTRAVENOUS

## 2019-08-16 MED ORDER — PROTAMINE SULFATE 10 MG/ML IV SOLN
INTRAVENOUS | Status: DC | PRN
  Administered 2019-08-16: 40 mg via INTRAVENOUS

## 2019-08-16 MED ORDER — SUCCINYLCHOLINE CHLORIDE 20 MG/ML IJ SOLN
INTRAMUSCULAR | Status: DC | PRN
  Administered 2019-08-16: 120 mg via INTRAVENOUS

## 2019-08-16 MED ORDER — PROPOFOL 10 MG/ML IV BOLUS
INTRAVENOUS | Status: DC | PRN
  Administered 2019-08-16: 140 mg via INTRAVENOUS

## 2019-08-16 MED ORDER — SUGAMMADEX SODIUM 200 MG/2ML IV SOLN
INTRAVENOUS | Status: DC | PRN
  Administered 2019-08-16: 200 mg via INTRAVENOUS

## 2019-08-16 MED ORDER — PHENYLEPHRINE HCL (PRESSORS) 10 MG/ML IV SOLN
INTRAVENOUS | Status: DC | PRN
  Administered 2019-08-16: 40 ug via INTRAVENOUS
  Administered 2019-08-16: 80 ug via INTRAVENOUS
  Administered 2019-08-16: 120 ug via INTRAVENOUS

## 2019-08-16 MED ORDER — HEPARIN SODIUM (PORCINE) 1000 UNIT/ML IJ SOLN
INTRAMUSCULAR | Status: DC | PRN
  Administered 2019-08-16: 5000 [IU] via INTRAVENOUS

## 2019-08-16 MED ORDER — SODIUM CHLORIDE 0.9 % IV SOLN: 250.0000 mL | INTRAVENOUS | Status: DC | PRN

## 2019-08-16 MED ORDER — SODIUM CHLORIDE 0.9% FLUSH: 3.0000 mL | Freq: Two times a day (BID) | INTRAVENOUS | Status: DC

## 2019-08-16 MED ORDER — HYDROCODONE-ACETAMINOPHEN 5-325 MG PO TABS: 1.0000 | ORAL_TABLET | ORAL | Status: DC | PRN

## 2019-08-16 MED ORDER — ESOMEPRAZOLE MAGNESIUM 40 MG PO CPDR: 40.0000 mg | DELAYED_RELEASE_CAPSULE | Freq: Every day | ORAL | 0 refills | Status: DC

## 2019-08-16 MED ORDER — HEPARIN (PORCINE) IN NACL 1000-0.9 UT/500ML-% IV SOLN
INTRAVENOUS | Status: DC | PRN
  Administered 2019-08-16: 500 mL

## 2019-08-16 MED ORDER — LIDOCAINE 2% (20 MG/ML) 5 ML SYRINGE
INTRAMUSCULAR | Status: DC | PRN
  Administered 2019-08-16: 40 mg via INTRAVENOUS

## 2019-08-16 MED ORDER — ISOPROTERENOL HCL 0.2 MG/ML IJ SOLN
INTRAVENOUS | Status: DC | PRN
  Administered 2019-08-16: 20 ug/min via INTRAVENOUS

## 2019-08-16 MED ORDER — DEXAMETHASONE SODIUM PHOSPHATE 10 MG/ML IJ SOLN
INTRAMUSCULAR | Status: DC | PRN
  Administered 2019-08-16: 10 mg via INTRAVENOUS

## 2019-08-16 SURGICAL SUPPLY — 20 items
BLANKET WARM UNDERBOD FULL ACC (MISCELLANEOUS) ×2 IMPLANT
CATH MAPPNG PENTARAY F 2-6-2MM (CATHETERS) IMPLANT
CATH SMTCH THERMOCOOL SF DF (CATHETERS) ×1 IMPLANT
CATH SOUNDSTAR ECO 8FR (CATHETERS) ×1 IMPLANT
CATH WEBSTER BI DIR CS D-F CRV (CATHETERS) ×1 IMPLANT
COVER SWIFTLINK CONNECTOR (BAG) ×2 IMPLANT
DEVICE CLOSURE PERCLS PRGLD 6F (VASCULAR PRODUCTS) IMPLANT
NDL BAYLIS TRANSSEPTAL 71CM (NEEDLE) IMPLANT
NEEDLE BAYLIS TRANSSEPTAL 71CM (NEEDLE) ×2 IMPLANT
PACK EP LATEX FREE (CUSTOM PROCEDURE TRAY) ×2
PACK EP LF (CUSTOM PROCEDURE TRAY) ×1 IMPLANT
PAD PRO RADIOLUCENT 2001M-C (PAD) ×2 IMPLANT
PATCH CARTO3 (PAD) ×1 IMPLANT
PENTARAY F 2-6-2MM (CATHETERS) ×4
PERCLOSE PROGLIDE 6F (VASCULAR PRODUCTS) ×6
SHEATH PINNACLE 7F 10CM (SHEATH) ×2 IMPLANT
SHEATH PINNACLE 8F 10CM (SHEATH) ×1 IMPLANT
SHEATH PINNACLE 9F 10CM (SHEATH) ×1 IMPLANT
SHEATH SWARTZ TS SL2 63CM 8.5F (SHEATH) ×1 IMPLANT
TUBING SMART ABLATE COOLFLOW (TUBING) ×1 IMPLANT

## 2019-08-16 NOTE — Progress Notes (Signed)
Discharge instructions reviewed with patient and family. Verbalized understanding. 

## 2019-08-16 NOTE — Discharge Instructions (Signed)
Post procedure care instructions No driving for 4 days. No lifting over 5 lbs for 1 week. No vigorous or sexual activity for 1 week. You may return to work/your usual activities on 08/23/2019. Keep procedure site clean & dry. If you notice increased pain, swelling, bleeding or pus, call/return!  You may shower, but no soaking baths/hot tubs/pools for 1 week.      Cardiac Ablation, Care After This sheet gives you information about how to care for yourself after your procedure. Your health care provider may also give you more specific instructions. If you have problems or questions, contact your health care provider. What can I expect after the procedure? After the procedure, it is common to have:  Bruising around your puncture site.  Tenderness around your puncture site.  Skipped heartbeats.  Tiredness (fatigue). Follow these instructions at home: Puncture site care   Follow instructions from your health care provider about how to take care of your puncture site. Make sure you: ? Wash your hands with soap and water before you change your bandage (dressing). If soap and water are not available, use hand sanitizer. ? Change your dressing as told by your health care provider. ? Leave stitches (sutures), skin glue, or adhesive strips in place. These skin closures may need to stay in place for up to 2 weeks. If adhesive strip edges start to loosen and curl up, you may trim the loose edges. Do not remove adhesive strips completely unless your health care provider tells you to do that.  Check your puncture site every day for signs of infection. Check for: ? Redness, swelling, or pain. ? Fluid or blood. If your puncture site starts to bleed, lie down on your back, apply firm pressure to the area, and contact your health care provider. ? Warmth. ? Pus or a bad smell. Driving  Ask your health care provider when it is safe for you to drive again after the procedure.  Do not drive or use heavy  machinery while taking prescription pain medicine.  Do not drive for 24 hours if you were given a medicine to help you relax (sedative) during your procedure. Activity  Avoid activities that take a lot of effort for at least 3 days after your procedure.  Do not lift anything that is heavier than 10 lb (4.5 kg), or the limit that you are told, until your health care provider says that it is safe.  Return to your normal activities as told by your health care provider. Ask your health care provider what activities are safe for you. General instructions  Take over-the-counter and prescription medicines only as told by your health care provider.  Do not use any products that contain nicotine or tobacco, such as cigarettes and e-cigarettes. If you need help quitting, ask your health care provider.  Do not take baths, swim, or use a hot tub until your health care provider approves.  Do not drink alcohol for 24 hours after your procedure.  Keep all follow-up visits as told by your health care provider. This is important. Contact a health care provider if:  You have redness, mild swelling, or pain around your puncture site.  You have fluid or blood coming from your puncture site that stops after applying firm pressure to the area.  Your puncture site feels warm to the touch.  You have pus or a bad smell coming from your puncture site.  You have a fever.  You have chest pain or discomfort that spreads  to your neck, jaw, or arm.  You are sweating a lot.  You feel nauseous.  You have a fast or irregular heartbeat.  You have shortness of breath.  You are dizzy or light-headed and feel the need to lie down.  You have pain or numbness in the arm or leg closest to your puncture site. Get help right away if:  Your puncture site suddenly swells.  Your puncture site is bleeding and the bleeding does not stop after applying firm pressure to the area. These symptoms may represent a  serious problem that is an emergency. Do not wait to see if the symptoms will go away. Get medical help right away. Call your local emergency services (911 in the [place] Do not drive yourself to the hospital. Summary  After the procedure, it is normal to have bruising and tenderness at the puncture site in your groin, neck, or forearm.  Check your puncture site every day for signs of infection.  Get help right away if your puncture site is bleeding and the bleeding does not stop after applying firm pressure to the area. This is a medical emergency. This information is not intended to replace advice given to you by your health care provider. Make sure you discuss any questions you have with your health care provider. Document Revised: 04/03/2017 Document Reviewed: 07/31/2016 Elsevier Patient Education  [place].

## 2019-08-16 NOTE — Anesthesia Procedure Notes (Signed)
Procedure Name: Intubation Date/Time: 08/16/2019 7:45 AM Performed by: Neldon Newport, CRNA Pre-anesthesia Checklist: Timeout performed, Patient being monitored, Suction available, Emergency Drugs available and Patient identified Patient Re-evaluated:Patient Re-evaluated prior to induction Oxygen Delivery Method: Circle system utilized Preoxygenation: Pre-oxygenation with 100% oxygen Induction Type: IV induction Ventilation: Mask ventilation without difficulty Laryngoscope Size: Mac and 4 Grade View: Grade I Tube type: Oral Tube size: 7.5 mm Number of attempts: 1 Placement Confirmation: ETT inserted through vocal cords under direct vision,  positive ETCO2 and breath sounds checked- equal and bilateral Secured at: 23 cm Tube secured with: Tape Dental Injury: Teeth and Oropharynx as per pre-operative assessment

## 2019-08-16 NOTE — Transfer of Care (Signed)
Immediate Anesthesia Transfer of Care Note  Patient: DONTAE MINERVA  Procedure(s) Performed: ATRIAL FIBRILLATION ABLATION (N/A )  Patient Location: Cath Lab  Anesthesia Type:General  Level of Consciousness: awake, alert  and oriented  Airway & Oxygen Therapy: Patient Spontanous Breathing and Patient connected to nasal cannula oxygen  Post-op Assessment: Report given to RN, Post -op Vital signs reviewed and stable and Patient moving all extremities X 4  Post vital signs: Reviewed and stable  Last Vitals:  Vitals Value Taken Time  BP 127/81 08/16/19 1115  Temp 36.6 C 08/16/19 1047  Pulse 70 08/16/19 1132  Resp 0 08/16/19 1132  SpO2 99 % 08/16/19 1132  Vitals shown include unvalidated device data.  Last Pain:  Vitals:   08/16/19 1109  TempSrc:   PainSc: 0-No pain      Patients Stated Pain Goal: 3 (08/16/19 3235)  Complications: No apparent anesthesia complications

## 2019-08-16 NOTE — Anesthesia Preprocedure Evaluation (Addendum)
Anesthesia Evaluation  Patient identified by MRN, date of birth, ID band Patient awake    Reviewed: Allergy & Precautions, NPO status   History of Anesthesia Complications (+) POST - OP SPINAL HEADACHE  Airway Mallampati: II  TM Distance: >3 FB Neck ROM: full    Dental  (+) Teeth Intact, Dental Advidsory Given   Pulmonary    breath sounds clear to auscultation       Cardiovascular + dysrhythmias Atrial Fibrillation  Rhythm:irregular     Neuro/Psych  Headaches,    GI/Hepatic Neg liver ROS, GERD  Medicated and Controlled,  Endo/Other  negative endocrine ROS  Renal/GU negative Renal ROS     Musculoskeletal  (+) Arthritis , Osteoarthritis,    Abdominal   Peds  Hematology   Anesthesia Other Findings   Reproductive/Obstetrics                            Anesthesia Physical Anesthesia Plan  ASA: III  Anesthesia Plan: General   Post-op Pain Management:    Induction: Intravenous  PONV Risk Score and Plan: 2 and Ondansetron, Dexamethasone and Midazolam  Airway Management Planned: Oral ETT  Additional Equipment:   Intra-op Plan:   Post-operative Plan: Possible Post-op intubation/ventilation  Informed Consent: I have reviewed the patients History and Physical, chart, labs and discussed the procedure including the risks, benefits and alternatives for the proposed anesthesia with the patient or authorized representative who has indicated his/her understanding and acceptance.     Dental Advisory Given and Dental advisory given  Plan Discussed with: Anesthesiologist, CRNA and Surgeon  Anesthesia Plan Comments:       Anesthesia Quick Evaluation

## 2019-08-16 NOTE — Interval H&P Note (Signed)
History and Physical Interval Note:  08/16/2019 7:24 AM  Mike Mccann  has presented today for surgery, with the diagnosis of afib.  The various methods of treatment have been discussed with the patient and family. After consideration of risks, benefits and other options for treatment, the patient has consented to  Procedure(s): ATRIAL FIBRILLATION ABLATION (N/A) as a surgical intervention.  The patient's history has been reviewed, patient examined, no change in status, stable for surgery.  I have reviewed the patient's chart and labs.  Questions were answered to the patient's satisfaction.    Cardiac CT reviewed at length with the patient today.  He reports compliance with xarelto without interruption.  Hillis Range

## 2019-08-16 NOTE — Progress Notes (Signed)
Dr [name] in and ok to d/c home 

## 2019-08-17 NOTE — Progress Notes (Signed)
Same Day PCI D/C Phone Call  ° [x]  1st call °        []  Answer. °        [x]  No Answer  ° ° []  2nd call  °      []  Answer °      []  No Answer  ° °Is there bleeding or other abnormality from access site? ° []  Yes   ° []  No ° °Has there been any chest pain, nausea or vomiting?   ° []  No ° []  Yes chest pain  ° []  Yes nausea or vomiting ° °Have you sought any medical attention since discharge?   ° []  Yes ° []  No ° °Remind Patient about their follow up appointment, review discharge & medication instructions.  ° []  Yes ° []  No ° °Are you continuing to take you daily ASA & antiplatlet inhibitor(Plavix, Effient, Brilinitia)? ° []  Yes ° []  No ° °Would you say you were satisfied with you Same Day Discharge?  °(Disagree, Neutral, Agree) ° ° []  Disagree, why ° []  Neutral ° []  Agree ° ° ° ° °

## 2019-08-19 ENCOUNTER — Telehealth: Payer: Self-pay | Admitting: Internal Medicine

## 2019-08-19 NOTE — Telephone Encounter (Signed)
Pt c/o BP issue: STAT if pt c/o blurred vision, one-sided weakness or slurred speech  1. What are your last 5 BP readings? 145/98 HR 68  2. Are you having any other symptoms (ex. Dizziness, headache, blurred vision, passed out)? Lightheaded, today is the first day since his ablation that he's been up and moving around. Has been taking tylenol for headaches.   3. What is your BP issue? BP is high. He states that he just didn't feel right today so he took his blood pressure and it was higher than normal.

## 2019-08-19 NOTE — Telephone Encounter (Signed)
I spoke to the patient who had an ablation on Tuesday 4/13 and his BP and HR have been elevated 140/96 and 70s, unusual for him.    His only symptom is lightheadedness and he has been in bed resting most of the time.  I advised him on his medication orders and he will try the Metoprolol Succinate  and Propanolol as prescribed.  He will monitor over the weekend and update [place] Monday 4/19.

## 2019-08-21 NOTE — Anesthesia Postprocedure Evaluation (Signed)
Anesthesia Post Note  Patient: Mike Mccann  Procedure(s) Performed: ATRIAL FIBRILLATION ABLATION (N/A )     Anesthesia Post Evaluation  Last Vitals:  Vitals:   08/16/19 1345 08/16/19 1348  BP: 119/77 119/77  Pulse: 73 70  Resp: 16 20  Temp:    SpO2: 98% 93%    Last Pain:  Vitals:   08/16/19 1109  TempSrc:   PainSc: 0-No pain                 Syncere Eble

## 2019-08-23 NOTE — Telephone Encounter (Signed)
See mychart message for follow up

## 2019-08-31 ENCOUNTER — Other Ambulatory Visit (HOSPITAL_COMMUNITY): Payer: Self-pay | Admitting: *Deleted

## 2019-08-31 MED ORDER — METOPROLOL SUCCINATE ER 25 MG PO TB24: ORAL_TABLET | ORAL | 6 refills | Status: DC

## 2019-09-13 ENCOUNTER — Encounter (HOSPITAL_COMMUNITY): Payer: Self-pay | Admitting: Nurse Practitioner

## 2019-09-13 ENCOUNTER — Ambulatory Visit (HOSPITAL_COMMUNITY)
Admission: RE | Admit: 2019-09-13 | Discharge: 2019-09-13 | Disposition: A | Discharge status: Home / Self Care | Payer: Federal, State, Local not specified - PPO | Source: Ambulatory Visit | Attending: Nurse Practitioner | Admitting: Nurse Practitioner

## 2019-09-13 ENCOUNTER — Other Ambulatory Visit: Payer: Self-pay

## 2019-09-13 VITALS — BP 122/82 | HR 78 | Ht 72.0 in | Wt 162.6 lb

## 2019-09-13 DIAGNOSIS — Z79899 Other long term (current) drug therapy: Secondary | ICD-10-CM | POA: Diagnosis not present

## 2019-09-13 DIAGNOSIS — Z96643 Presence of artificial hip joint, bilateral: Secondary | ICD-10-CM | POA: Insufficient documentation

## 2019-09-13 DIAGNOSIS — Z9889 Other specified postprocedural states: Secondary | ICD-10-CM | POA: Diagnosis not present

## 2019-09-13 DIAGNOSIS — Z7901 Long term (current) use of anticoagulants: Secondary | ICD-10-CM | POA: Insufficient documentation

## 2019-09-13 DIAGNOSIS — I480 Paroxysmal atrial fibrillation: Secondary | ICD-10-CM

## 2019-09-13 DIAGNOSIS — D6869 Other thrombophilia: Secondary | ICD-10-CM | POA: Diagnosis not present

## 2019-09-13 NOTE — Progress Notes (Signed)
Primary Care Physician: Rodrigo Ran, MD Referring Physician: Dr. Hillis Range Mike Mccann is a 59 y.o. male with a h/o paroxysmal afib, that is here for one month afib ablation f/u. He has noted a few PAC's, sent strips and this was confirmed. He had one hour of afib since the procedure. He uses very low dose metoprolol when needed for PAC's. No swallowing or groin issues.   Today, he denies symptoms of palpitations, chest pain, shortness of breath, orthopnea, PND, lower extremity edema, dizziness, presyncope, syncope, or neurologic sequela. The patient is tolerating medications without difficulties and is otherwise without complaint today.   Past Medical History:  Diagnosis Date  . Abnormal finding on EKG October 2013    a) 2-D Echo, Mod RA& mild LA dilation, RVSP 33 mmHg. Nl LV function - EF > 55%. No valvular dz;; b) Myoview: ~ mild basal-mid inferoseptal ischemia: A low risk scan with excellent exercise capacity of 15 METS, exercising for 13 min, but horizontal ST depression in Lat leads;; c) repeat Myoview 02/2013: LOW RISK study w/ diaphragmatic attenuation   . Arthritis    hip  . Complication of anesthesia    given more medications than usual-"unsure if that happend"  . Dyslipidemia, goal LDL below 130    On statin, "ratio is really off"  . GERD (gastroesophageal reflux disease)   . Paroxysmal atrial fibrillation [facility])    Past Surgical History:  Procedure Laterality Date  . ATRIAL FIBRILLATION ABLATION N/A 08/16/2019   Procedure: ATRIAL FIBRILLATION ABLATION;  Surgeon: Hillis Range, MD;  [facility] INVAS[facility] CV LAB;  Service: Cardiovascular;  Laterality: N/A;  . CARDIAC CATHETERIZATION N/A 02/27/2016   Procedure: Left Heart Cath and Coronary Angiography;  Surgeon: [name], MD;  Location: [facility] INVASIVE CV LAB;  Service: Cardiovascular;  Laterality: N/A;  . CARDIAC CATHETERIZATION N/A 02/27/2016   Procedure: Intravascular Ultrasound/IVUS;  Surgeon: [name], MD;   Location: [facility] INVASIVE CV LAB;  Service: Cardiovascular;  Laterality: N/A;  . COLONOSCOPY  2 years ago  . TOTAL HIP ARTHROPLASTY Left 04/04/2014   Procedure: LEFT TOTAL HIP ARTHROPLASTY ANTERIOR APPROACH;  Surgeon: [name], MD;  Location: [facility] ORS;  Service: Orthopedics;  Laterality: Left;  . TOTAL HIP ARTHROPLASTY Right 05/02/2014   Procedure: TOTAL RIGHT HIP ARTHROPLASTY ANTERIOR APPROACH;  Surgeon: [name], MD;  Location: [facility] ORS;  Service: Orthopedics;  Laterality: Right;  [name] VASECTOMY  8 years ago  . WISDOM TOOTH EXTRACTION  10 years ago    Current Outpatient Medications  Medication Sig Dispense Refill  . esomeprazole (NEXIUM) 40 MG capsule Take 1 capsule (40 mg total) by mouth daily. 45 capsule 0  . MAGNESIUM PO Take 1 tablet by mouth daily.    . metoprolol succinate (TOPROL-XL) 25 MG 24 hr tablet Take 6.25 tablet (1/4 tablet) by mouth daily as needed. 15 tablet 6  . Multiple Vitamins-Minerals (MULTIVITAMIN ADULT PO) Take 1 tablet by mouth daily.    . rivaroxaban (XARELTO) 20 MG TABS tablet Take 1 tablet (20 mg total) by mouth daily with supper. 30 tablet 11  . simvastatin (ZOCOR) 40 MG tablet Take 40 mg by mouth daily.   2  . sodium chloride (OCEAN) 0.65 % SOLN nasal spray Place 1 spray into both nostrils daily.    . vitamin B-12 (CYANOCOBALAMIN) 1000 MCG tablet Take 1,000 mcg by mouth daily.     . propranolol (INDERAL) 10 MG tablet Take 1 tablet (10 mg total) by mouth 4 (four) times  daily as needed (Heart rate control). (Patient not taking: Reported on 09/13/2019) 120 tablet 11   No current facility-administered medications for this encounter.    No Known Allergies  Social History   Socioeconomic History  . Marital status: Married    Spouse name: Not on file  . Number of children: Not on file  . Years of education: Not on file  . Highest education level: Not on file  Occupational History  . Not on file  Tobacco Use  . Smoking status: Never Smoker  . Smokeless  tobacco: Never Used  Substance and Sexual Activity  . Alcohol use: Yes    Comment: wine once a month  . Drug use: No  . Sexual activity: Not on file  Other Topics Concern  . Not on file  Social History Narrative   Married father of 2. Works full-time for [employer] in the [employer].   He [place] in the [place] and swims in the outside pool in the summer and at the [facility] during the wintertime. He also rides his bicycle when his hips are not bothering him.  Married to Lucky Cowboy (ENT) MD   Social Determinants of Health   Financial Resource Strain:   . Difficulty of Paying Living Expenses:   Food Insecurity:   . Worried About Programme researcher, broadcasting/film/video in the Last Year:   . Barista in the Last Year:   Transportation Needs:   . Freight forwarder (Medical):   [name] Lack of Transportation (Non-Medical):   Physical Activity:   . Days of Exercise per Week:   . Minutes of Exercise per Session:   Stress:   . Feeling of Stress :   Social Connections:   . Frequency of Communication with Friends and Family:   . Frequency of Social Gatherings with Friends and Family:   . Attends Religious Services:   . Active Member of Clubs or Organizations:   . Attends Banker Meetings:   [name] Marital Status:   Intimate Partner Violence:   . Fear of Current or Ex-Partner:   . Emotionally Abused:   [name] Physically Abused:   . Sexually Abused:     Family History  Problem Relation Age of Onset  . Heart attack Maternal Grandfather   . Heart attack Maternal Uncle     ROS- All systems are reviewed and negative except as per the HPI above  Physical Exam: Vitals:   09/13/19 0836  BP: 122/82  Pulse: 78  Weight: 73.8 kg  Height: 6' (1.829 m)   Wt Readings from Last 3 Encounters:  09/13/19 73.8 kg  08/16/19 70.3 kg  08/12/19 70.3 kg    Labs: Lab Results  Component Value Date   NA 135 08/02/2019   K 4.9 08/02/2019   CL 102 08/02/2019   CO2 27  08/02/2019   GLUCOSE 95 08/02/2019   BUN 15 08/02/2019   CREATININE 1.02 08/02/2019   CALCIUM 9.5 08/02/2019   Lab Results  Component Value Date   INR 1.1 01/31/2016   No results found for: CHOL, HDL, LDLCALC, TRIG   GEN- The patient is well appearing, alert and oriented x 3 today.   Head- normocephalic, atraumatic Eyes-  Sclera clear, conjunctiva pink Ears- hearing intact Oropharynx- clear Neck- supple, no JVP Lymph- no cervical lymphadenopathy Lungs- Clear to ausculation bilaterally, normal work of breathing Heart- Regular rate and rhythm, no murmurs, rubs or gallops, PMI not laterally displaced GI- soft, NT,  ND, + BS Extremities- no clubbing, cyanosis, or edema MS- no significant deformity or atrophy Skin- no rash or lesion Psych- euthymic mood, full affect Neuro- strength and sensation are intact  EKG-NSR at 78 bpm, PR int 174 ms, qrs int 98 ms, qtc 426 ms    Assessment and Plan: 1. Paroxysmal afib  Doing well since ablation  Use metoprolol as needed to control PAC's, afib   2. CHA2DS2VASc score of 0 Continue xarelto 20 mg daily without interruption  Anticipate this will be stopped at 3 month f/u Having some nosebleeds Somewhat chronic for pt and he is able to control them  Has had cautery done before   F/u with Dr. Rayann Heman 7/12  Butch Penny C. Emet Rafanan, [facility] [address] [place], [place] [address] [phone]

## 2019-09-27 MED ORDER — METOPROLOL SUCCINATE ER 25 MG PO TB24
ORAL_TABLET | ORAL | 3 refills | Status: DC
Start: 2019-09-27 — End: 2022-02-24

## 2019-10-11 ENCOUNTER — Ambulatory Visit (HOSPITAL_COMMUNITY): Payer: Federal, State, Local not specified - PPO | Admitting: Nurse Practitioner

## 2019-10-12 DIAGNOSIS — H524 Presbyopia: Secondary | ICD-10-CM | POA: Diagnosis not present

## 2019-10-12 DIAGNOSIS — H2513 Age-related nuclear cataract, bilateral: Secondary | ICD-10-CM | POA: Diagnosis not present

## 2019-10-12 DIAGNOSIS — H25013 Cortical age-related cataract, bilateral: Secondary | ICD-10-CM | POA: Diagnosis not present

## 2019-10-12 DIAGNOSIS — H40013 Open angle with borderline findings, low risk, bilateral: Secondary | ICD-10-CM | POA: Diagnosis not present

## 2019-10-12 DIAGNOSIS — H43812 Vitreous degeneration, left eye: Secondary | ICD-10-CM | POA: Diagnosis not present

## 2019-11-14 ENCOUNTER — Ambulatory Visit: Payer: Federal, State, Local not specified - PPO | Admitting: Internal Medicine

## 2019-11-21 ENCOUNTER — Encounter: Payer: Self-pay | Admitting: Internal Medicine

## 2019-11-21 ENCOUNTER — Ambulatory Visit: Payer: Federal, State, Local not specified - PPO | Admitting: Internal Medicine

## 2019-11-21 ENCOUNTER — Other Ambulatory Visit: Payer: Self-pay

## 2019-11-21 VITALS — BP 116/74 | HR 67 | Ht 72.0 in | Wt 160.6 lb

## 2019-11-21 DIAGNOSIS — I480 Paroxysmal atrial fibrillation: Secondary | ICD-10-CM

## 2019-11-21 NOTE — Progress Notes (Signed)
PCP: Rodrigo Ran, MD  CC: afib  Mike Mccann is a 59 y.o. male who presents today for routine electrophysiology followup.  Since his recent afib ablation, the patient reports doing very well.  he denies procedure related complications.  He has had some ERAF (by Mike Mccann) and palpitations but feels that these are improving.  He has mild breathlessness which he thinks could be anxiety. Today, he denies symptoms of  chest pain,  lower extremity edema, dizziness, presyncope, or syncope.  The patient is otherwise without complaint today.   Past Medical History:  Diagnosis Date  . Abnormal finding on EKG October 2013    a) 2-D Echo, Mod RA& mild LA dilation, RVSP 33 mmHg. Nl LV function - EF > 55%. No valvular dz;; b) Myoview: ~ mild basal-mid inferoseptal ischemia: A low risk scan with excellent exercise capacity of 15 METS, exercising for 13 min, but horizontal ST depression in Lat leads;; c) repeat Myoview 02/2013: LOW RISK study w/ diaphragmatic attenuation   . Arthritis    hip  . Complication of anesthesia    given more medications than usual-"unsure if that happend"  . Dyslipidemia, goal LDL below 130    On statin, "ratio is really off"  . GERD (gastroesophageal reflux disease)   . Paroxysmal atrial fibrillation [facility])    Past Surgical History:  Procedure Laterality Date  . ATRIAL FIBRILLATION ABLATION N/A 08/16/2019   Procedure: ATRIAL FIBRILLATION ABLATION;  Surgeon: Hillis Range, MD;  [facility] INV[facility] CV LAB;  Service: Cardiovascular;  Laterality: N/A;  . CARDIAC CATHETERIZATION N/A 02/27/2016   Procedure: Left Heart Cath and Coronary Angiography;  Surgeon: [name], MD;  Location: [facility] INVASIVE CV LAB;  Service: Cardiovascular;  Laterality: N/A;  . CARDIAC CATHETERIZATION N/A 02/27/2016   Procedure: Intravascular Ultrasound/IVUS;  Surgeon: [name], MD;  Location: [facility] INVASIVE CV LAB;  Service: Cardiovascular;  Laterality: N/A;  . COLONOSCOPY  2 years ago  . TOTAL  HIP ARTHROPLASTY Left 04/04/2014   Procedure: LEFT TOTAL HIP ARTHROPLASTY ANTERIOR APPROACH;  Surgeon: [name], MD;  Location: [facility]S;  Service: Orthopedics;  Laterality: Left;  . TOTAL HIP ARTHROPLASTY Right 05/02/2014   Procedure: TOTAL RIGHT HIP ARTHROPLASTY ANTERIOR APPROACH;  Surgeon: [name], MD;  Location: [facility] ORS;  Service: Orthopedics;  Laterality: Right;  [name] VASECTOMY  8 years ago  . WISDOM TOOTH EXTRACTION  10 years ago    ROS- all systems are personally reviewed and negatives except as per HPI above  Current Outpatient Medications  Medication Sig Dispense Refill  . famotidine (PEPCID) 10 MG tablet Take 10 mg by mouth 2 (two) times daily.    [name] MAGNESIUM PO Take 1 tablet by mouth daily.    . metoprolol succinate (TOPROL XL) 25 MG 24 hr tablet Take 1/2 tablet by mouth as needed for palpitations. 90 tablet 3  . Multiple Vitamins-Minerals (MULTIVITAMIN ADULT PO) Take 1 tablet by mouth daily.    . propranolol (INDERAL) 10 MG tablet Take 1 tablet (10 mg total) by mouth 4 (four) times daily as needed (Heart rate control). 120 tablet 11  . rivaroxaban (XARELTO) 20 MG TABS tablet Take 1 tablet (20 mg total) by mouth daily with supper. 30 tablet 11  . simvastatin (ZOCOR) 40 MG tablet Take 40 mg by mouth daily.   2  . sodium chloride (OCEAN) 0.65 % SOLN nasal spray Place 1 spray into both nostrils daily.    . vitamin B-12 (CYANOCOBALAMIN) 1000 MCG tablet Take 1,000  mcg by mouth daily.     [name] esomeprazole (NEXIUM) 40 MG capsule Take 1 capsule (40 mg total) by mouth daily. 45 capsule 0   No current facility-administered medications for this visit.    Physical Exam: Vitals:   11/21/19 1628  BP: 116/74  Pulse: 67  SpO2: 98%  Weight: 160 lb 9.6 oz (72.8 kg)  Height: 6' (1.829 m)    GEN- The patient is well appearing, alert and oriented x 3 today.   Head- normocephalic, atraumatic Eyes-  Sclera clear, conjunctiva pink Ears- hearing intact Oropharynx- clear Lungs- Clear to  ausculation bilaterally, normal work of breathing Heart- Regular rate and rhythm, no murmurs, rubs or gallops, PMI not laterally displaced GI- soft, NT, ND, + BS Extremities- no clubbing, cyanosis, or edema  EKG tracing ordered today is personally reviewed and shows sinus rhythm 67 bpm, PR 180 msec, QRS 100 msec, nonspecific St/T changes  Assessment and Plan:  1. Paroxysmal atrial fibrillation Doing well s/p ablation, with some palpitations/ ERAF We will continue to monitor with his Mike Mccann.  Pt reassured today chads2vasc score is 0 Stop xarelto Wean metoprolol as able  Return to see me in 3 months  Hillis Range MD, [facility] 11/21/2019 4:32 PM

## 2019-11-21 NOTE — Patient Instructions (Addendum)
Medication Instructions:  Your physician has recommended you make the following change in your medication:  1 Stop Xarelto  *If you need a refill on your cardiac medications before your next appointment, please call your pharmacy*  Lab Work: None ordered.  If you have labs (blood work) drawn today and your tests are completely normal, you will receive your results only by: [name] MyChart Message (if you have MyChart) OR . A paper copy in the mail If you have any lab test that is abnormal or we need to change your treatment, we will call you to review the results.  Testing/Procedures: None ordered.  Follow-Up: At [facility], you and your health needs are our priority.  As part of our continuing mission to provide you with exceptional heart care, we have created designated Provider Care Teams.  These Care Teams include your primary Cardiologist (physician) and Advanced Practice Providers (APPs -  Physician Assistants and Nurse Practitioners) who all work together to provide you with the care you need, when you need it.  We recommend signing up for the patient portal called "MyChart".  Sign up information is provided on this After Visit Summary.  MyChart is used to connect with patients for Virtual Visits (Telemedicine).  Patients are able to view lab/test results, encounter notes, upcoming appointments, etc.  Non-urgent messages can be sent to your provider as well.   To learn more about what you can do with MyChart, go to [email or web address].    Your next appointment:   Your physician wants you to follow-up in: 3 Months WITH DR. [name]. Oct 18,2021 at 830 at the [address] office   Other Instructions:

## 2019-11-28 ENCOUNTER — Ambulatory Visit: Payer: Federal, State, Local not specified - PPO | Admitting: Internal Medicine

## 2019-12-19 ENCOUNTER — Ambulatory Visit: Payer: Federal, State, Local not specified - PPO | Admitting: Internal Medicine

## 2020-01-05 DIAGNOSIS — Z03818 Encounter for observation for suspected exposure to other biological agents ruled out: Secondary | ICD-10-CM | POA: Diagnosis not present

## 2020-01-05 DIAGNOSIS — Z20822 Contact with and (suspected) exposure to covid-19: Secondary | ICD-10-CM | POA: Diagnosis not present

## 2020-01-07 DIAGNOSIS — Z20822 Contact with and (suspected) exposure to covid-19: Secondary | ICD-10-CM | POA: Diagnosis not present

## 2020-02-09 DIAGNOSIS — I480 Paroxysmal atrial fibrillation: Secondary | ICD-10-CM | POA: Diagnosis not present

## 2020-02-20 ENCOUNTER — Ambulatory Visit: Payer: Federal, State, Local not specified - PPO | Admitting: Internal Medicine

## 2020-02-20 ENCOUNTER — Encounter: Payer: Self-pay | Admitting: Internal Medicine

## 2020-02-20 ENCOUNTER — Other Ambulatory Visit: Payer: Self-pay

## 2020-02-20 VITALS — BP 120/78 | HR 71 | Ht 72.0 in | Wt 163.6 lb

## 2020-02-20 DIAGNOSIS — I480 Paroxysmal atrial fibrillation: Secondary | ICD-10-CM | POA: Diagnosis not present

## 2020-02-20 MED ORDER — FLECAINIDE ACETATE 50 MG PO TABS
50.0000 mg | ORAL_TABLET | Freq: Two times a day (BID) | ORAL | 3 refills | Status: DC
Start: 2020-02-20 — End: 2021-12-25

## 2020-02-20 NOTE — Progress Notes (Signed)
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 male who presents today for routine electrophysiology followup.  Since last being seen in our clinic, the patient reports doing very well.  He continues to have episodes of afib about twice per week, lasting around 30 minutes.  + rare palpitations of PACs/PVCs also.  Today, he denies symptoms of chest pain, shortness of breath,  lower extremity edema, dizziness, presyncope, or syncope.  The patient is otherwise without complaint today.   Past Medical History:  Diagnosis Date  . Abnormal finding on EKG October 2013    a) 2-D Echo, Mod RA& mild LA dilation, RVSP 33 mmHg. Nl LV function - EF > 55%. No valvular dz;; b) Myoview: ~ mild basal-mid inferoseptal ischemia: A low risk scan with excellent exercise capacity of 15 METS, exercising for 13 min, but horizontal ST depression in Lat leads;; c) repeat Myoview 02/2013: LOW RISK study w/ diaphragmatic attenuation   . Arthritis    hip  . Complication of anesthesia    given more medications than usual-"unsure if that happend"  . Dyslipidemia, goal LDL below 130    On statin, "ratio is really off"  . GERD (gastroesophageal reflux disease)   . Paroxysmal atrial fibrillation [facility])    Past Surgical History:  Procedure Laterality Date  . ATRIAL FIBRILLATION ABLATION N/A 08/16/2019   Procedure: ATRIAL FIBRILLATION ABLATION;  Surgeon: Hillis Range, MD;  [facility]: [facility] INVASIVE CV LAB;  Service: Cardiovascular;  Laterality: N/A;  . CARDIAC CATHETERIZATION N/A 02/27/2016   Procedure: Left Heart Cath and Coronary Angiography;  Surgeon: [name], MD;  Location: [facility] INVASIVE CV LAB;  Service: Cardiovascular;  Laterality: N/A;  . CARDIAC CATHETERIZATION N/A 02/27/2016   Procedure: Intravascular Ultrasound/IVUS;  Surgeon: [name], MD;  Location: [facility] INVASIVE CV LAB;  Service: Cardiovascular;  Laterality: N/A;  . COLONOSCOPY  2 years ago  . TOTAL HIP ARTHROPLASTY Left  04/04/2014   Procedure: LEFT TOTAL HIP ARTHROPLASTY ANTERIOR APPROACH;  Surgeon: [name], MD;  Location: [facility];  Service: Orthopedics;  Laterality: Left;  . TOTAL HIP ARTHROPLASTY Right 05/02/2014   Procedure: TOTAL RIGHT HIP ARTHROPLASTY ANTERIOR APPROACH;  Surgeon: [name], MD;  Location: [facility];  Service: Orthopedics;  Laterality: Right;  [name] VASECTOMY  8 years ago  . WISDOM TOOTH EXTRACTION  10 years ago    ROS- all systems are reviewed and negatives except as per HPI above  Current Outpatient Medications  Medication Sig Dispense Refill  . famotidine (PEPCID) 10 MG tablet Take 10 mg by mouth 2 (two) times daily.    [name] MAGNESIUM PO Take 1 tablet by mouth daily.    . metoprolol succinate (TOPROL XL) 25 MG 24 hr tablet Take 1/2 tablet by mouth as needed for palpitations. 90 tablet 3  . Multiple Vitamins-Minerals (MULTIVITAMIN ADULT PO) Take 1 tablet by mouth daily.    . propranolol (INDERAL) 10 MG tablet Take 1 tablet (10 mg total) by mouth 4 (four) times daily as needed (Heart rate control). 120 tablet 11  . simvastatin (ZOCOR) 40 MG tablet Take 40 mg by mouth daily.   2  . sodium chloride (OCEAN) 0.65 % SOLN nasal spray Place 1 spray into both nostrils daily.    . vitamin B-12 (CYANOCOBALAMIN) 1000 MCG tablet Take 1,000 mcg by mouth daily.     [name] esomeprazole (NEXIUM) 40 MG capsule Take 1 capsule (40 mg total) by mouth daily. 45 capsule  0   No current facility-administered medications for this visit.    Physical Exam: Vitals:   02/20/20 0835  BP: 120/78  Pulse: 71  SpO2: 96%  Weight: 163 lb 9.6 oz (74.2 kg)  Height: 6' (1.829 m)    GEN- The patient is well appearing, alert and oriented x 3 today.   Head- normocephalic, atraumatic Eyes-  Sclera clear, conjunctiva pink Ears- hearing intact Oropharynx- clear Lungs-   normal work of breathing Heart- Regular rate and rhythm  GI- soft  Extremities- no clubbing, cyanosis, or edema  Wt Readings from Last 3  Encounters:  02/20/20 163 lb 9.6 oz (74.2 kg)  11/21/19 160 lb 9.6 oz (72.8 kg)  09/13/19 162 lb 9.6 oz (73.8 kg)    EKG tracing ordered today is personally reviewed and shows sinus rhythm  Assessment and Plan:  1. Paroxysmal atrial fibrillation Doing well s/p ablation  Continues to have afib episodes.    He monitors with his Lourena Simmonds mobile This has also shown a regular tachycardia, likely due to SVT. chads2vasc score is 0.  He is not on [facility] currently.  Therapeutic strategies for afib including medicine (flecainide) and repeat ablation were discussed in detail with the patient today. Risk, benefits, and alternatives to each approach were discussed today.  At this time, we will try flecainide. Start flecainide 50mg  BID.  He can increase this to 100mg  BID if palpitations resume. We will revisit his arrhythmia in 2 months.  If controlled with flecainide, we will plan ETT at that time.  I have advised that he avoid rigorous exercise in the interim.   Risks, benefits and potential toxicities for medications prescribed and/or refilled reviewed with patient today.   MD, [facility] 02/20/2020 8:48 AM

## 2020-02-20 NOTE — Patient Instructions (Addendum)
Medication Instructions:  1 Flecainide 50 mg two times a day  *If you need a refill on your cardiac medications before your next appointment, please call your pharmacy*  Lab Work: None ordered.  If you have labs (blood work) drawn today and your tests are completely normal, you will receive your results only by: [name] MyChart Message (if you have MyChart) OR . A paper copy in the mail If you have any lab test that is abnormal or we need to change your treatment, we will call you to review the results.  Testing/Procedures: None ordered.  Follow-Up: At [facility], you and your health needs are our priority.  As part of our continuing mission to provide you with exceptional heart care, we have created designated Provider Care Teams.  These Care Teams include your primary Cardiologist (physician) and Advanced Practice Providers (APPs -  Physician Assistants and Nurse Practitioners) who all work together to provide you with the care you need, when you need it.  We recommend signing up for the patient portal called "MyChart".  Sign up information is provided on this After Visit Summary.  MyChart is used to connect with patients for Virtual Visits (Telemedicine).  Patients are able to view lab/test results, encounter notes, upcoming appointments, etc.  Non-urgent messages can be sent to your provider as well.   To learn more about what you can do with MyChart, go to [email or web address].    Your next appointment:   Your physician wants you to follow-up in: 04/23/20 at 10:15 am    Other Instructions:

## 2020-03-19 DIAGNOSIS — I480 Paroxysmal atrial fibrillation: Secondary | ICD-10-CM | POA: Diagnosis not present

## 2020-03-27 DIAGNOSIS — U071 COVID-19: Secondary | ICD-10-CM | POA: Diagnosis not present

## 2020-04-23 ENCOUNTER — Ambulatory Visit: Payer: Federal, State, Local not specified - PPO | Admitting: Internal Medicine

## 2020-04-26 ENCOUNTER — Ambulatory Visit: Payer: Federal, State, Local not specified - PPO | Admitting: Internal Medicine

## 2020-05-10 DIAGNOSIS — I480 Paroxysmal atrial fibrillation: Secondary | ICD-10-CM | POA: Diagnosis not present

## 2020-05-14 DIAGNOSIS — E785 Hyperlipidemia, unspecified: Secondary | ICD-10-CM | POA: Diagnosis not present

## 2020-05-14 DIAGNOSIS — Z125 Encounter for screening for malignant neoplasm of prostate: Secondary | ICD-10-CM | POA: Diagnosis not present

## 2020-05-18 DIAGNOSIS — Z0000 Encounter for general adult medical examination without abnormal findings: Secondary | ICD-10-CM | POA: Diagnosis not present

## 2020-05-18 DIAGNOSIS — D6869 Other thrombophilia: Secondary | ICD-10-CM | POA: Diagnosis not present

## 2020-05-18 DIAGNOSIS — Z1331 Encounter for screening for depression: Secondary | ICD-10-CM | POA: Diagnosis not present

## 2020-05-23 DIAGNOSIS — Z1212 Encounter for screening for malignant neoplasm of rectum: Secondary | ICD-10-CM | POA: Diagnosis not present

## 2020-05-29 DIAGNOSIS — U071 COVID-19: Secondary | ICD-10-CM | POA: Diagnosis not present

## 2020-09-19 DIAGNOSIS — H40013 Open angle with borderline findings, low risk, bilateral: Secondary | ICD-10-CM | POA: Diagnosis not present

## 2021-02-08 DIAGNOSIS — M25512 Pain in left shoulder: Secondary | ICD-10-CM | POA: Diagnosis not present

## 2021-02-14 DIAGNOSIS — I480 Paroxysmal atrial fibrillation: Secondary | ICD-10-CM | POA: Diagnosis not present

## 2021-02-22 DIAGNOSIS — M25512 Pain in left shoulder: Secondary | ICD-10-CM | POA: Diagnosis not present

## 2021-03-07 DIAGNOSIS — M25512 Pain in left shoulder: Secondary | ICD-10-CM | POA: Diagnosis not present

## 2021-03-22 DIAGNOSIS — M25512 Pain in left shoulder: Secondary | ICD-10-CM | POA: Diagnosis not present

## 2021-04-05 DIAGNOSIS — S46912A Strain of unspecified muscle, fascia and tendon at shoulder and upper arm level, left arm, initial encounter: Secondary | ICD-10-CM | POA: Diagnosis not present

## 2021-04-05 DIAGNOSIS — M25512 Pain in left shoulder: Secondary | ICD-10-CM | POA: Diagnosis not present

## 2021-06-25 DIAGNOSIS — Z125 Encounter for screening for malignant neoplasm of prostate: Secondary | ICD-10-CM | POA: Diagnosis not present

## 2021-06-25 DIAGNOSIS — E785 Hyperlipidemia, unspecified: Secondary | ICD-10-CM | POA: Diagnosis not present

## 2021-07-02 ENCOUNTER — Other Ambulatory Visit: Payer: Self-pay | Admitting: Internal Medicine

## 2021-07-02 DIAGNOSIS — Z0000 Encounter for general adult medical examination without abnormal findings: Secondary | ICD-10-CM | POA: Diagnosis not present

## 2021-07-02 DIAGNOSIS — Z1331 Encounter for screening for depression: Secondary | ICD-10-CM | POA: Diagnosis not present

## 2021-07-02 DIAGNOSIS — E785 Hyperlipidemia, unspecified: Secondary | ICD-10-CM

## 2021-07-02 DIAGNOSIS — Z1339 Encounter for screening examination for other mental health and behavioral disorders: Secondary | ICD-10-CM | POA: Diagnosis not present

## 2021-07-02 DIAGNOSIS — D6869 Other thrombophilia: Secondary | ICD-10-CM | POA: Diagnosis not present

## 2021-07-26 ENCOUNTER — Inpatient Hospital Stay: Admission: RE | Admit: 2021-07-26 | Payer: Federal, State, Local not specified - PPO | Source: Ambulatory Visit

## 2021-11-13 DIAGNOSIS — E785 Hyperlipidemia, unspecified: Secondary | ICD-10-CM | POA: Diagnosis not present

## 2021-11-13 DIAGNOSIS — R82998 Other abnormal findings in urine: Secondary | ICD-10-CM | POA: Diagnosis not present

## 2021-11-29 DIAGNOSIS — H25012 Cortical age-related cataract, left eye: Secondary | ICD-10-CM | POA: Diagnosis not present

## 2021-11-29 DIAGNOSIS — H40013 Open angle with borderline findings, low risk, bilateral: Secondary | ICD-10-CM | POA: Diagnosis not present

## 2021-12-23 DIAGNOSIS — M25511 Pain in right shoulder: Secondary | ICD-10-CM | POA: Diagnosis not present

## 2021-12-24 ENCOUNTER — Encounter: Payer: Self-pay | Admitting: Cardiovascular Disease

## 2021-12-24 NOTE — Progress Notes (Unsigned)
Cardiology Office Note   Date:  12/25/2021   ID:  Mike Mccann, Mike Mccann [birth year], MRN [id number]  PCP:  Mike Ran, MD  Cardiologist:   Mike Miss, MD   No chief complaint on file.  Problem list 1. Paroxysmal atrial fibrillation ( CHADS2VASC = 0)  2. Hyperlipidemia   Previous notes from Sept. 28, 2017 Mike Mccann) :   Mike Mccann is a 61 y.o. male with a history of CP> echo 2013 w/ mild-mod biatrial dilat, EF 55%, MV 2014 w/ diaphragmatic attn, EF 44%, hx tachycardia (never caught), GERD, HLD.   Mike Mccann presents for evaluation of syncope, palpitations  He has had intermittent palpitations and tachycardia for several years. The episodes were brief and never caught on ECG. He was not terribly symptomatic with them. However recently, he has had longer duration of just not feeling well, and feeling his heart beat out of rhythm at times. He has not had problems with tachycardia.  Starting in the spring, he would have episodes where he did not feel well but then would feel normal for a while. However, over the last couple of months, he has felt a little bit bad all the time. His heart rate has been running higher than usual, in the 60s and 70s. He has been able to exercise without difficulty. He has been waking up with a headache most days of the week. This is treated with Aleve with success.  This weekend, he had not done anything unusual and had not had anything unusual to eat and drink. He had not had a large amount of alcohol. He began feeling bad, he became diaphoretic and lightheaded. He noted on his feet that that his heart rate was 40. He sat down but did not feel much better. He had a syncopal episode. It did not last very long. EMS was called. Prior to EMS arrival, his heart rate was back up to 50 and he felt much better. He was not transported and no ECG is available. He did not document any heart rates < 40.   He gets occasional nosebleeds in the winter months,  and he uses saline nasal spray to try and prevent those. He has no other bleeding issues. He has had no chest pain or shortness of breath. He's had no increased dyspnea on exertion.   03/06/2016: Mike Mccann is seen today for follow-up of his paroxysmal atrial fibrillation. Since I last saw him in the hospital he has had a cardiac cath which revealed normal coronary arteries. He has normal left ventricle systolic function with an estimated ejection fraction of 55-65%. His echocardiogram reveals normal left ventricle systolic function. He has mild diastolic dysfunction. He has trace mitral regurgitation and mild tricuspid regurgitation.   He has been seen in the A. fib clinic on Oct. 11 , 2017.  He has been using the [place] monitor.    Has frequent episodes of PAF in the AM.   Typical episodes last 3 hours He has minimal symptoms if he is at work at his desk If he has to walk around , then he might have some lightheadedness and does not feel very well.   Feb. 2, 2018:  Mike Mccann is seen back for follow up of his atrial fib. Saw Mike Mccann in Nov.    Mike Mccann does not want to start any arrhythmic therapy   CHADS2VASC is 0.   Takes Propranolol  Has had several episodes of PAF.   Doesn't make  him feel poorly but he can feel like his HR is Irreg.   Has started exercising recently  - swimming again recently    Sept. 11, 2018:  Mike Mccann is seen today for follow up . Is doing great. Has some occasional palpitations.   Has not had any prolonged episodes of Afib.  Exercising regularly .   Is exercising at a moderate pace.   August 24, 2017:  Mike Mccann is seen today for follow up of his PAF  Has not had any further episodes of atrial fib. Has some irregular beats - seems to be stress related.  Still has some palpitations once a week. Exercises about daily   March 09, 2018:  Mike Mccann is seen today for a recurrent episode of paroxysmal atrial fibrillation.  He had a colonoscopy this morning and was noted to have  atrial fibrillation during the procedure.  He was largely asymptomatic but he did feel a little lethargic from the procedure.  He went home and ate some chicken noodle soup.  He took several ECG readings with his [name] - he was definitely in atrial fibrillation.  He then later converted to normal sinus rhythm.  He is feeling well now.  Has any syncope or presyncope  April 06, 2019: Mike Mccann is seen today for follow-up of his paroxysmal atrial fibrillation. Has been having more PAF with vigorous  Exercise  ( tennis )  Takes a 1/4 propranolol when he has these palpitations.   A whole propranolol will cause extreme fatigue .  Also takes metoprolol 1/4 of a 25 mg tab before playing tennis which seems to help  Has been swimming and cycling more  Verifies his PAF with Kardia.  Occurs 4-5 times a month  Occasionally will wake up with atrial fib.    Episodes will last an hour.    Aug. 23, 2023  Mike Mccann is seen for follow up of his PAF  S/p afib ablation with [name] in 2021 Still  has some PAF - now sees Mike Muta, MD   at [facility]  Is on Flecainide 75 BID which seems to control the afib Perhaps has 1 episode of Afib for an hour every 3-4 weeks    R arm is in a sling .  Possible rotator cuff tear from tennis injury .  Takes Dilt CD 120 a day      Past Medical History:  Diagnosis Date   Abnormal finding on EKG October 2013    a) 2-D Echo, Mod RA& mild LA dilation, RVSP 33 mmHg. Nl LV function - EF > 55%. No valvular dz;; b) Myoview: ~ mild basal-mid inferoseptal ischemia: A low risk scan with excellent exercise capacity of 15 METS, exercising for 13 min, but horizontal ST depression in Lat leads;; c) repeat Myoview 02/2013: LOW RISK study w/ diaphragmatic attenuation    Arthritis    hip   Complication of anesthesia    given more medications than usual-"unsure if that happend"   Dyslipidemia, goal LDL below 130    On statin, "ratio is really off"   GERD (gastroesophageal reflux  disease)    Paroxysmal atrial fibrillation (HCC)     Past Surgical History:  Procedure Laterality Date   ATRIAL FIBRILLATION ABLATION N/A 08/16/2019   Procedure: ATRIAL FIBRILLATION ABLATION;  Surgeon: Hillis Range, MD;  Location: [facility];  Service: Cardiovascular;  Laterality: N/A;   CARDIAC CATHETERIZATION N/A 02/27/2016   Procedure: Left Heart Cath and Coronary Angiography;  Surgeon: [name], MD;  Location: [facility];  Service: Cardiovascular;  Laterality: N/A;   CARDIAC CATHETERIZATION N/A 02/27/2016   Procedure: Intravascular Ultrasound/IVUS;  Surgeon: [name], MD;  Location: [facility];  Service: Cardiovascular;  Laterality: N/A;   COLONOSCOPY  2 years ago   TOTAL HIP ARTHROPLASTY Left 04/04/2014   Procedure: LEFT TOTAL HIP ARTHROPLASTY ANTERIOR APPROACH;  Surgeon: [name], MD;  Location: [facility];  Service: Orthopedics;  Laterality: Left;   TOTAL HIP ARTHROPLASTY Right 05/02/2014   Procedure: TOTAL RIGHT HIP ARTHROPLASTY ANTERIOR APPROACH;  Surgeon: [name], MD;  Location: [facility];  Service: Orthopedics;  Laterality: Right;   VASECTOMY  8 years ago   WISDOM TOOTH EXTRACTION  10 years ago    Current Outpatient Medications  Medication Sig Dispense Refill   diltiazem (CARDIZEM CD) 120 MG 24 hr capsule Take 120 mg by mouth daily.     esomeprazole (NEXIUM) 40 MG capsule Take 1 capsule (40 mg total) by mouth daily. 45 capsule 0   ezetimibe (ZETIA) 10 MG tablet Take 10 mg by mouth daily.     famotidine (PEPCID) 10 MG tablet Take 10 mg by mouth 2 (two) times daily.     flecainide (TAMBOCOR) 150 MG tablet 1/2 tab twice a day Orally     MAGNESIUM PO Take 1 tablet by mouth daily.     Multiple Vitamins-Minerals (MULTIVITAMIN ADULT PO) Take 1 tablet by mouth daily.     simvastatin (ZOCOR) 40 MG tablet Take 40 mg by mouth daily.   2   sodium chloride (OCEAN) 0.65 % SOLN nasal spray Place 1 spray into both nostrils daily.     vitamin B-12  (CYANOCOBALAMIN) 1000 MCG tablet Take 1,000 mcg by mouth daily.      metoprolol succinate (TOPROL XL) 25 MG 24 hr tablet Take 1/2 tablet by mouth as needed for palpitations. (Patient not taking: Reported on 12/25/2021) 90 tablet 3   propranolol (INDERAL) 10 MG tablet Take 1 tablet (10 mg total) by mouth 4 (four) times daily as needed (Heart rate control). (Patient not taking: Reported on 12/25/2021) 120 tablet 11   No current facility-administered medications for this visit.    Allergies:   Patient has no known allergies.    Social History:  The patient  reports that he has never smoked. He has never used smokeless tobacco. He reports current alcohol use. He reports that he does not use drugs.   Family History:  The patient's family history includes Heart attack in his maternal grandfather and maternal uncle.    ROS:  Please see the history of present illness. All other systems are reviewed and negative.    Physical Exam: Blood pressure 118/68, pulse (!) 59, height 6' (1.829 m), weight 161 lb (73 kg), SpO2 99 %.       GEN:  Well nourished, well developed in no acute distress HEENT: Normal NECK: No JVD; No carotid bruits LYMPHATICS: No lymphadenopathy CARDIAC: RRR , no murmurs, rubs, gallops RESPIRATORY:  Clear to auscultation without rales, wheezing or rhonchi  ABDOMEN: Soft, non-tender, non-distended MUSCULOSKELETAL:  No edema; No deformity  SKIN: Warm and dry NEUROLOGIC:  Alert and oriented x 3    EKG:    December 25, 2021: Sinus bradycardia first-degree AV block.  Nonspecific T wave.   Recent Labs: No results found for requested labs within last 365 days.    Lipid Panel No results found for: "CHOL", "TRIG", "HDL", "CHOLHDL", "VLDL", "LDLCALC", "LDLDIRECT"   Wt Readings from  Last 3 Encounters:  12/25/21 161 lb (73 kg)  02/20/20 163 lb 9.6 oz (74.2 kg)  11/21/19 160 lb 9.6 oz (72.8 kg)     Other studies Reviewed: Additional studies/ records that were reviewed today  include: Office notes and testing.  ASSESSMENT AND PLAN:  1.  Atrial fibrillation: had an ablation .   Did not really work very well. Is now on Flecainide 75 BID which seems to be helping . Is following with Mike Mccann at [facility].      CHADS2VSC is 0   - is on  ASA 81 Has Some Xarelto on hand and propranolol on hand if needed.   2. Anticoagulation: CHA2DS2VASc=0,   3.  Hyperlipidemia :   is on Simvastatin and dilt I have suggested that he change to rosuvastatin instead of the Simva He will discuss with Dr. Elizabeth Sauer, MD  12/25/2021 8:25 AM    [facility] [address] [place],  [address] [place], [place]  [address] Pager [phone] Phone: [phone]; Fax: [phone]

## 2021-12-25 ENCOUNTER — Encounter: Payer: Self-pay | Admitting: Cardiovascular Disease

## 2021-12-25 ENCOUNTER — Ambulatory Visit: Payer: Federal, State, Local not specified - PPO | Admitting: Cardiovascular Disease

## 2021-12-25 VITALS — BP 118/68 | HR 59 | Ht 72.0 in | Wt 161.0 lb

## 2021-12-25 DIAGNOSIS — I480 Paroxysmal atrial fibrillation: Secondary | ICD-10-CM | POA: Diagnosis not present

## 2021-12-25 NOTE — Patient Instructions (Addendum)
I suggest Rosuvastatin 10 mg a day instead Simvastatin   The Simvastatin can potentially interact with the Diltiazem .   Medication Instructions:  Your physician recommends that you continue on your current medications as directed. Please refer to the Current Medication list given to you today.   *If you need a refill on your cardiac medications before your next appointment, please call your pharmacy*   Follow-Up: At [facility], you and your health needs are our priority.  As part of our continuing mission to provide you with exceptional heart care, we have created designated Provider Care Teams.  These Care Teams include your primary Cardiologist (physician) and Advanced Practice Providers (APPs -  Physician Assistants and Nurse Practitioners) who all work together to provide you with the care you need, when you need it.  Your next appointment:   1 year(s)  The format for your next appointment:   In Person  Provider:   Kristeen Miss, MD {

## 2022-01-24 DIAGNOSIS — M25512 Pain in left shoulder: Secondary | ICD-10-CM | POA: Diagnosis not present

## 2022-02-11 DIAGNOSIS — M19011 Primary osteoarthritis, right shoulder: Secondary | ICD-10-CM | POA: Diagnosis not present

## 2022-02-11 DIAGNOSIS — X58XXXA Exposure to other specified factors, initial encounter: Secondary | ICD-10-CM | POA: Diagnosis not present

## 2022-02-11 DIAGNOSIS — Y999 Unspecified external cause status: Secondary | ICD-10-CM | POA: Diagnosis not present

## 2022-02-11 DIAGNOSIS — M7541 Impingement syndrome of right shoulder: Secondary | ICD-10-CM | POA: Diagnosis not present

## 2022-02-11 DIAGNOSIS — M75121 Complete rotator cuff tear or rupture of right shoulder, not specified as traumatic: Secondary | ICD-10-CM | POA: Diagnosis not present

## 2022-02-11 DIAGNOSIS — G8918 Other acute postprocedural pain: Secondary | ICD-10-CM | POA: Diagnosis not present

## 2022-02-11 DIAGNOSIS — M24111 Other articular cartilage disorders, right shoulder: Secondary | ICD-10-CM | POA: Diagnosis not present

## 2022-02-11 DIAGNOSIS — S46011A Strain of muscle(s) and tendon(s) of the rotator cuff of right shoulder, initial encounter: Secondary | ICD-10-CM | POA: Diagnosis not present

## 2022-02-24 ENCOUNTER — Ambulatory Visit (HOSPITAL_COMMUNITY)
Admission: RE | Admit: 2022-02-24 | Discharge: 2022-02-24 | Disposition: A | Discharge status: Home / Self Care | Payer: Federal, State, Local not specified - PPO | Source: Ambulatory Visit | Attending: Cardiology | Admitting: Cardiology

## 2022-02-24 ENCOUNTER — Other Ambulatory Visit (HOSPITAL_COMMUNITY): Payer: Self-pay | Admitting: Specialist

## 2022-02-24 ENCOUNTER — Encounter: Payer: Self-pay | Admitting: Cardiology

## 2022-02-24 ENCOUNTER — Encounter (HOSPITAL_COMMUNITY): Payer: Self-pay

## 2022-02-24 ENCOUNTER — Ambulatory Visit: Payer: Federal, State, Local not specified - PPO | Admitting: Cardiology

## 2022-02-24 VITALS — BP 118/76 | HR 74 | Ht 72.0 in | Wt 160.0 lb

## 2022-02-24 DIAGNOSIS — I82621 Acute embolism and thrombosis of deep veins of right upper extremity: Secondary | ICD-10-CM | POA: Insufficient documentation

## 2022-02-24 DIAGNOSIS — I480 Paroxysmal atrial fibrillation: Secondary | ICD-10-CM | POA: Diagnosis not present

## 2022-02-24 DIAGNOSIS — M79601 Pain in right arm: Secondary | ICD-10-CM

## 2022-02-24 DIAGNOSIS — Z4789 Encounter for other orthopedic aftercare: Secondary | ICD-10-CM | POA: Diagnosis not present

## 2022-02-24 DIAGNOSIS — I82409 Acute embolism and thrombosis of unspecified deep veins of unspecified lower extremity: Secondary | ICD-10-CM | POA: Insufficient documentation

## 2022-02-24 MED ORDER — APIXABAN 5 MG PO TABS: 5.0000 mg | ORAL_TABLET | Freq: Two times a day (BID) | ORAL | 2 refills | Status: DC

## 2022-02-24 NOTE — Progress Notes (Unsigned)
Right arm venous duplex exam performed at the [facility] office this morning. Patient has non-occlusive DVT in the right brachial veins, proximal humerus down to antecubital fossa. Right IJV and subclavian veins are patent. Radial and Ulnar veins not seen due to patient's inability to externally rotate arm.  Preliminary findings found under CV PROC.  Patient scheduled to see Dr. Ellyn Hack at 1:00 pm (DOD). Dr. Sydnee Cabal notifies with results at 11:50 am.  Salvadore Dom, RVT, RDCS, RDMS [facility] at [facility]

## 2022-02-24 NOTE — Progress Notes (Unsigned)
Primary Care Provider: [name], MD [place] Cardiologist: Mike Moores, MD Electrophysiologist: None  Clinic Note: No chief complaint on file.   ===================================  ASSESSMENT/PLAN   Problem List Items Addressed This Visit       Cardiology Problems   DVT (deep venous thrombosis) (HCC)   Relevant Medications   rosuvastatin (CRESTOR) 20 MG tablet   Paroxysmal atrial fibrillation (HCC) - Primary   Relevant Medications   rosuvastatin (CRESTOR) 20 MG tablet    ===================================  HPI:    Mike Mccann is a 61 y.o. male who is being seen today for the evaluation of *** at the request of Mike Infante, MD.  Mike Mccann was last seen on ***  Recent Hospitalizations: ***  Reviewed  CV studies:    The following studies were reviewed today: (if available, images/films reviewed: From Epic Chart or Care Everywhere) ***:   Interval History:   Mike Mccann   CV Review of Symptoms (Summary) Cardiovascular ROS: {roscv:310661}  REVIEWED OF SYSTEMS   ROS  I have reviewed and (if needed) personally updated the patient's problem list, medications, allergies, past medical and surgical history, social and family history.   PAST MEDICAL HISTORY   Past Medical History:  Diagnosis Date   Abnormal finding on EKG October 2013    a) 2-D Echo, Mod RA& mild LA dilation, RVSP 33 mmHg. Nl LV function - EF > 55%. No valvular dz;; b) Myoview: ~ mild basal-mid inferoseptal ischemia: A low risk scan with excellent exercise capacity of 15 METS, exercising for 13 min, but horizontal ST depression in Lat leads;; c) repeat Myoview 02/2013: LOW RISK study w/ diaphragmatic attenuation    Arthritis    hip   Complication of anesthesia    given more medications than usual-"unsure if that happend"   Dyslipidemia, goal LDL below 130    On statin, "ratio is really off"   GERD (gastroesophageal reflux disease)    Paroxysmal atrial  fibrillation ([place])     PAST SURGICAL HISTORY   Past Surgical History:  Procedure Laterality Date   ATRIAL FIBRILLATION ABLATION N/A 08/16/2019   Procedure: ATRIAL FIBRILLATION ABLATION;  Surgeon: Thompson Grayer, MD;  Location: [place] CV LAB;  Service: Cardiovascular;  Laterality: N/A;   CARDIAC CATHETERIZATION N/A 02/27/2016   Procedure: Left Heart Cath and Coronary Angiography;  Surgeon: Nelva Bush, MD;  Location: [place] CV LAB;  Service: Cardiovascular;  Laterality: N/A;   CARDIAC CATHETERIZATION N/A 02/27/2016   Procedure: Intravascular Ultrasound/IVUS;  Surgeon: Nelva Bush, MD;  Location: [place] CV LAB;  Service: Cardiovascular;  Laterality: N/A;   COLONOSCOPY  2 years ago   TOTAL HIP ARTHROPLASTY Left 04/04/2014   Procedure: LEFT TOTAL HIP ARTHROPLASTY ANTERIOR APPROACH;  Surgeon: Mauri Pole, MD;  Location: [facility];  Service: Orthopedics;  Laterality: Left;   TOTAL HIP ARTHROPLASTY Right 05/02/2014   Procedure: TOTAL RIGHT HIP ARTHROPLASTY ANTERIOR APPROACH;  Surgeon: Mauri Pole, MD;  Location: [facility];  Service: Orthopedics;  Laterality: Right;   VASECTOMY  8 years ago   WISDOM TOOTH EXTRACTION  10 years ago    Immunization History  Administered Date(s) Administered   Influenza, Quadrivalent, Recombinant, Inj, Pf 01/15/2019   PFIZER Comirnaty(Gray Top)Covid-19 Tri-Sucrose Vaccine 07/09/2019, 07/30/2019, 03/06/2020   Zoster Recombinat (Shingrix) 09/06/2017, 11/24/2017    MEDICATIONS/ALLERGIES   Current Meds  Medication Sig   acetaminophen (TYLENOL) 500 MG tablet Take 500 mg by mouth every 6 (six) hours as needed. PRN  diltiazem (CARDIZEM CD) 120 MG 24 hr capsule Take 120 mg by mouth daily.   docusate sodium (COLACE) 100 MG capsule Take 100 mg by mouth 2 (two) times daily. PRN   esomeprazole (NEXIUM) 40 MG capsule Take 1 capsule (40 mg total) by mouth daily.   ezetimibe (ZETIA) 10 MG tablet Take 10 mg by mouth daily.   flecainide (TAMBOCOR) 150  MG tablet 1/2 tab twice a day Orally   ibuprofen (ADVIL) 200 MG tablet Take 200 mg by mouth every 6 (six) hours as needed. Takes 400mg  by mouth as needed.   Multiple Vitamins-Minerals (MULTIVITAMIN ADULT PO) Take 1 tablet by mouth daily.   rosuvastatin (CRESTOR) 20 MG tablet Take 20 mg by mouth daily.   sodium chloride (OCEAN) 0.65 % SOLN nasal spray Place 1 spray into both nostrils daily.   vitamin B-12 (CYANOCOBALAMIN) 1000 MCG tablet Take 1,000 mcg by mouth daily.     No Known Allergies  SOCIAL HISTORY/FAMILY HISTORY   Reviewed in Epic:   Social History   Tobacco Use   Smoking status: Never   Smokeless tobacco: Never  Substance Use Topics   Alcohol use: Yes    Comment: wine once a month   Drug use: No   Social History   Social History Narrative   Married father of 2. Works full-time for [employer] in the [employer].   He lives in the [place] and swims in the outside pool in the summer and at the [facility] during the wintertime. He also rides his bicycle when his hips are not bothering him.  Married to Mike Mccann (ENT) MD   Family History  Problem Relation Age of Onset   Heart attack Maternal Grandfather    Heart attack Maternal Uncle     OBJCTIVE -PE, EKG, labs   Wt Readings from Last 3 Encounters:  02/24/22 160 lb (72.6 kg)  12/25/21 161 lb (73 kg)  02/20/20 163 lb 9.6 oz (74.2 kg)    Physical Exam: BP 118/76   Pulse 74   Ht 6' (1.829 m)   Wt 160 lb (72.6 kg)   SpO2 98%   BMI 21.70 kg/m  Physical Exam   Adult ECG Report  Rate: *** ;  Rhythm: {rhythm:17366};   Narrative Interpretation: ***  Recent Labs:  ***  No results found for: "CHOL", "HDL", "LDLCALC", "LDLDIRECT", "TRIG", "CHOLHDL" Lab Results  Component Value Date   CREATININE 1.02 08/02/2019   BUN 15 08/02/2019   NA 135 08/02/2019   K 4.9 08/02/2019   CL 102 08/02/2019   CO2 27 08/02/2019      Latest Ref Rng & Units 08/02/2019    8:50 AM 02/27/2016   10:49  AM 01/31/2016    9:11 AM  CBC  WBC 3.4 - 10.8 x10E3/uL 4.3  3.9  5.3   Hemoglobin 13.0 - 17.7 g/dL 15.1  15.2  15.7   Hematocrit 37.5 - 51.0 % 43.8  42.9  45.4   Platelets 150 - 450 x10E3/uL 198  193  213     No results found for: "HGBA1C" Lab Results  Component Value Date   TSH 4.73 (H) 01/31/2016    ================================================== I spent a total of *** minutes with the patient spent in direct patient consultation.  Additional time spent with chart review  / charting (studies, outside notes, etc): *** min Total Time: *** min  Current medicines are reviewed at length with the patient today.  (+/- concerns) ***  Notice: This  dictation was prepared with Dragon dictation along with smart phrase technology. Any transcriptional errors that result from this process are unintentional and may not be corrected upon review.   Studies Ordered:  No orders of the defined types were placed in this encounter.  No orders of the defined types were placed in this encounter.   Patient Instructions / Medication Changes & Studies & Tests Ordered   There are no Patient Instructions on file for this visit.    Leonie Man, MD, MS Glenetta Hew, M.D., M.S. Interventional Cardiologist  [place]  Pager # [phone] Phone # [phone] [address] [place], [place] [address]   Thank you for choosing [place] at [place]!!

## 2022-02-24 NOTE — Patient Instructions (Signed)
Medication Instructions:    Start taking Eliquis 10 mg ( 2 tablet ) twice a day for 7 days then decrease to 5 mg ( 1 tablet) twice a day    *If you need a refill on your cardiac medications before your next appointment, please call your pharmacy*   Lab Work: Not needed    Testing/Procedures:   Will be schedule at [address] in Jan 2023 Your physician has requested that you have a  right upper extremity arterial duplex. This test is an ultrasound of the arteries in the  right arm. It looks at arterial blood flow in the  arms. Allow one hour for Upper Arterial scans. There are no restrictions or special instructions    Follow-Up: At [facility], you and your health needs are our priority.  As part of our continuing mission to provide you with exceptional heart care, we have created designated Provider Care Teams.  These Care Teams include your primary Cardiologist (physician) and Advanced Practice Providers (APPs -  Physician Assistants and Nurse Practitioners) who all work together to provide you with the care you need, when you need it.     Your next appointment:   3 month(s)  The format for your next appointment:   In Person  Provider:   Mertie Moores, MD    Other Instructions

## 2022-02-25 ENCOUNTER — Encounter: Payer: Self-pay | Admitting: Cardiovascular Disease

## 2022-02-25 ENCOUNTER — Telehealth: Payer: Self-pay | Admitting: Cardiology

## 2022-02-25 DIAGNOSIS — M25512 Pain in left shoulder: Secondary | ICD-10-CM | POA: Diagnosis not present

## 2022-02-25 NOTE — Telephone Encounter (Signed)
Called and spoke with patients wife (okay per DPR) regarding patient. Per patients wife patient has been having some chest wall pain with deep inspiration. Patient reports that he feels like the pain has worsened and reports that they were so focused on the clot in patients arm yesterday that they did not mention it to Dr. Ellyn Hack when patient was seen. Patient also reports that the pain is very sharp and radiates through his chest. Patients wife states she is concerned about possible PE due to recent diagnosis of Brachial DVT.   Patients wife also concerned about possible medication interactions between diltiazem and Eliquis and increased risk of bleeding since patient was started on Eliquis 10mg  BID yesterday by Dr. Ellyn Hack. Patients wife states that she gave patient only one dose (5mg ) of Eliquis this morning instead of 10mg .    Spoke with DOD (Dr. Harriet Masson)- who does not recommend outpatient scan as patient is being treated with Eliquis 10mg  BID already- recommends that if patient's symptoms are urgent should report to ER for urgent scan. Per DOD also recommends that if selective scan is preferred patient can get this order from PCP or primary cardiologist.   Spoke with PharmD's Gerald Stabs and Erasmo Downer) who state that slight interaction (increased risk of bleeding) between diltiazem and Eliquis but state that the benefit of the Eliquis outweighs the risk in this situation and patient should continue on the Eliquis 10mg  BID as prescribed.

## 2022-02-25 NOTE — Telephone Encounter (Signed)
Spoke with patients wife (okay per DPR) and advised her of DOD's recommendations and PharmD's recommendations. Patients wife states that she will give patient the prescribed dose of Eliquis (10mg ) and will report to ER for any urgent concerns. Patients wife states that patient is not short of breath, and reports that he is feeling okay at rest. Advised her of ER precautions- patients wife verbalized understanding and very grateful for return call.   Will forward to patients primary cardiologist to make aware.

## 2022-02-25 NOTE — Telephone Encounter (Signed)
  Pt c/o of Chest Pain: STAT if CP now or developed within 24 hours  1. Are you having CP right now? No   2. Are you experiencing any other symptoms (ex. SOB, nausea, vomiting, sweating)?   3. How long have you been experiencing CP? 3 days ago  4. Is your CP continuous or coming and going? CP when breathing   5. Have you taken Nitroglycerin? No   Pt's wife calling, she said pt pain in his chest wall when breathing is getting worst. They are not sure what to do and would like to speak with Dr. Allison Quarry nurse. She also wants to ask about pt's eliquis, they read that there may be an drug interaction between eliquis and diltiazem. They want to send phone note to Dr. Ellyn Hack since they saw him yesterday  ?

## 2022-02-25 NOTE — Telephone Encounter (Signed)
error 

## 2022-02-26 ENCOUNTER — Encounter: Payer: Self-pay | Admitting: Cardiology

## 2022-02-26 NOTE — Assessment & Plan Note (Signed)
Although frequent.  Now will be on DOAC despite CHA2DS2-VASc 0.  Temporarily on DOAC for DVT.  Continue flecainide and diltiazem.

## 2022-02-26 NOTE — Assessment & Plan Note (Signed)
Right upper extremity DVT related to recent surgery.  I think this is a triggered event probably from pressure caused by the cushion on his sling.  Plan:  Initiate Eliquis treatment regimen with 10 mg twice daily for 5 days followed by 5 mg twice daily for least 3 months.  We will check Dopplers in 3 months to determine length of course.  Hold off on vigorous rehab of the shoulder for couple weeks in order to for anticoagulation, but okay to proceed with light PT as planned.

## 2022-02-28 DIAGNOSIS — M25512 Pain in left shoulder: Secondary | ICD-10-CM | POA: Diagnosis not present

## 2022-03-04 ENCOUNTER — Encounter: Payer: Self-pay | Admitting: Cardiovascular Disease

## 2022-03-04 DIAGNOSIS — M25512 Pain in left shoulder: Secondary | ICD-10-CM | POA: Diagnosis not present

## 2022-03-06 DIAGNOSIS — M25512 Pain in left shoulder: Secondary | ICD-10-CM | POA: Diagnosis not present

## 2022-03-06 IMAGING — CT CT HEART MORPH/PULM VEIN W/ CM & W/O CA SCORE
2 of 9 series · 6 of 20 positions shown, 7 images · IV contrast (APPLIED)
Comparison: None.
COMPARISON: None.

Addendum:
EXAM:
OVER-READ INTERPRETATION  CT CHEST

The following report is an over-read performed by radiologist Dr.
Sarai Duhon [REDACTED] on 08/12/2019. This over-read
does not include interpretation of cardiac or coronary anatomy or
pathology. The coronary CTA interpretation by the cardiologist is
attached.
TECHNIQUE: The patient was scanned on a Siemens [REDACTED]ice scanner. Gantry
rotation speed was 250 msecs. Collimation was 0.6 mm. A 100 kV
prospective scan was triggered in the ascending thoracic aorta at
35-75% of the R-R interval. Average HR during the scan was 60 bpm.
The 3D data set was interpreted on a dedicated work station using
MPR, MIP and VRT modes. A total of 80cc of contrast was used.

[Series 10: best diast · axial · 0.39mm/px · z∈[+1021,+1104]mm · 3 of 418 slices shown, 4 images]
[im 105/418  vessel]
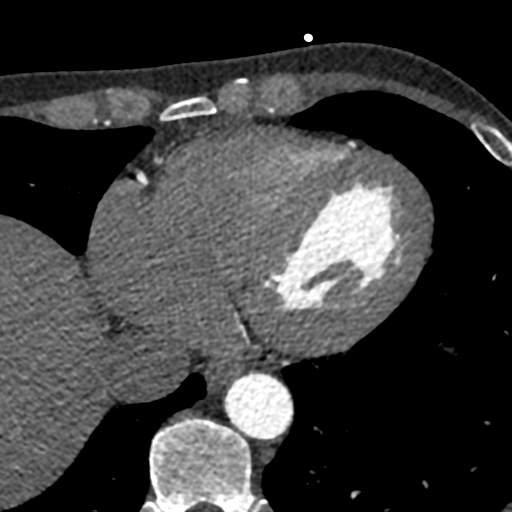
[im 105/418  lung]
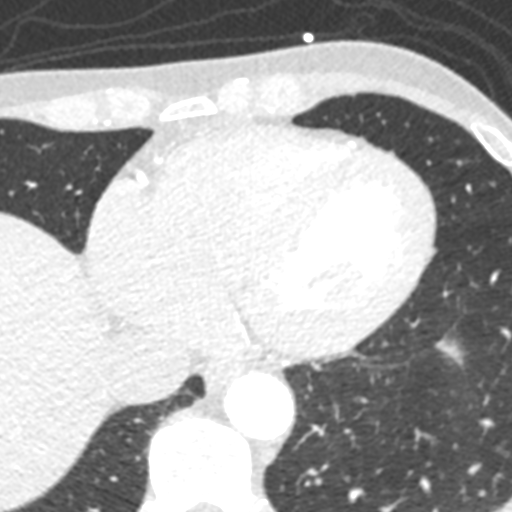
[im 209/418  vessel]
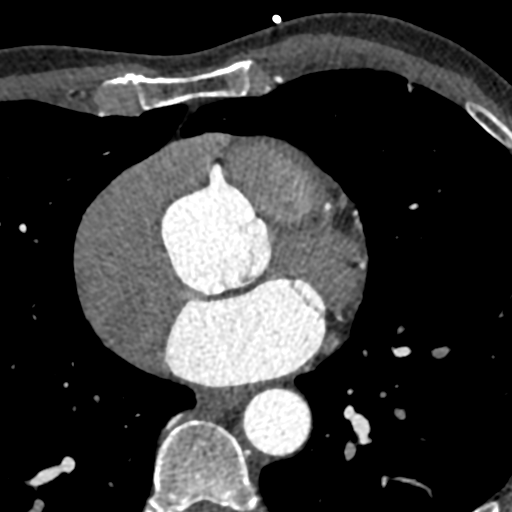
[im 313/418  vessel]
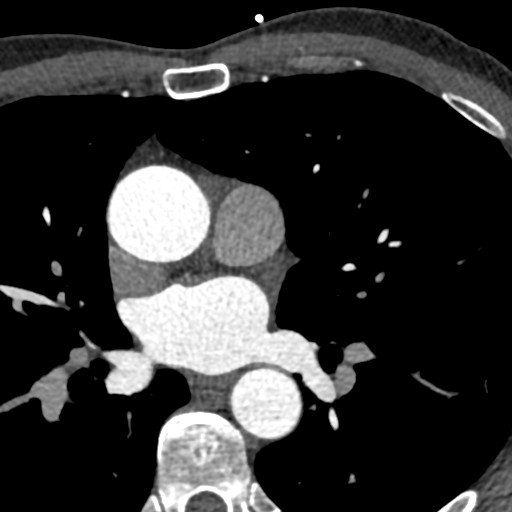

[Series 11: +300 ms · axial · 0.39mm/px · z∈[+1021,+1104]mm · 3 of 418 slices shown]
[im 105/418  vessel]
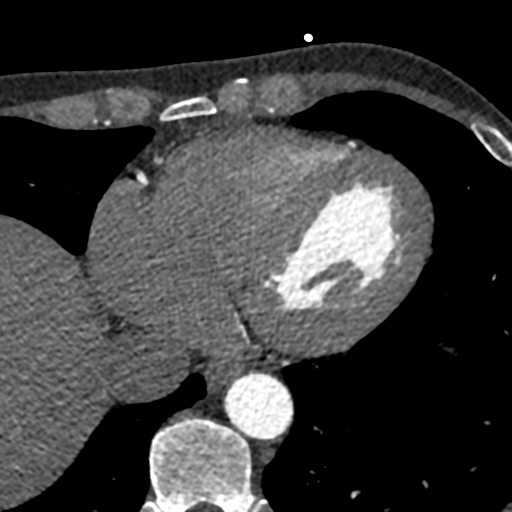
[im 209/418  vessel]
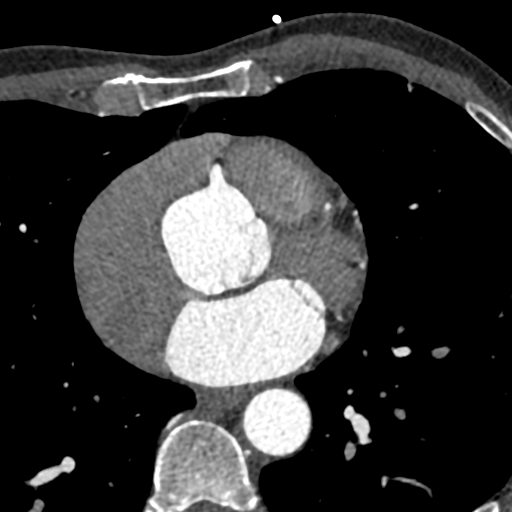
[im 313/418  vessel]
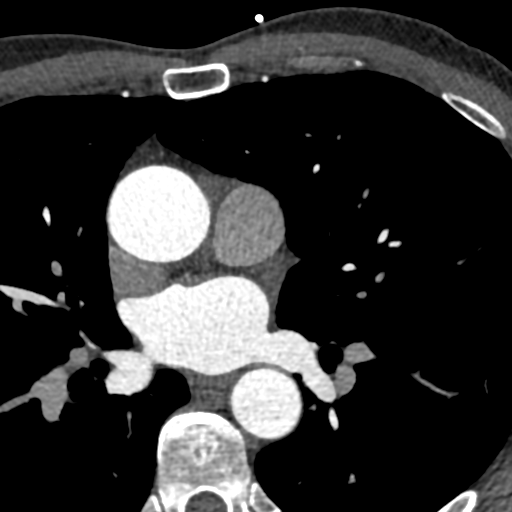

[6 of 20 positions shown; findings below may reference images not displayed]

FINDINGS: Vascular: Aortic atherosclerosis. No central pulmonary embolism, on
this non-dedicated study.

Mediastinum/Nodes: No imaged thoracic adenopathy.

Lungs/Pleura: No pleural fluid. Minimal clustered partially
calcified nodularity within the left upper lobe is likely post
infectious or inflammatory in 29/15.

A 2 mm left lower lobe pulmonary nodule on 50/15.

A calcified right middle lobe pulmonary nodule on 22/15.

Upper Abdomen: Normal imaged portions of the liver, spleen, stomach.

Musculoskeletal: No acute osseous abnormality.
IMPRESSION: 1.  No acute findings in the imaged extracardiac chest.
2.  Aortic Atherosclerosis (GALUZ-YG6.6).
3. Left lower lobe pulmonary nodule of 2 mm. No follow-up needed if
patient is low-risk. Non-contrast chest CT can be considered in 12
months if patient is high-risk. This recommendation follows the
consensus statement: Guidelines for Management of Incidental
Pulmonary Nodules Detected on CT Images: From the [REDACTED]AL DATA:  Atrial fibrillation

EXAM:
Cardiac CTA

MEDICATIONS:
No medications.
FINDINGS: Non-cardiac: See separate report from [REDACTED].

Mildly dilated aortic root at sinuses of valsalva measuring 4.0 cm
and ascending aorta measuring 3.9 cm.

Pulmonary veins drained normally to the left atrium. No LA appendage
thrombus noted.

PV measurements:

LUPV 22 x 16 mm

LLPV 21 x 12 mm.  Close proximity to the descending thoracic aorta.

RUPV 17 x 13

RLPV 21 x 17

Calcium Score: 2.85 Agatston units.

Coronary Arteries: Right dominant with no anomalies

LM: No plaque or stenosis.

LAD system: No plaque or stenosis.

Circumflex system: Small vessel, no plaque or stenosis.

RCA system: No plaque or stenosis.
IMPRESSION: 1.  Pulmonary veins as noted above.

2.  No LA appendage thrombus noted.

3.  No significant coronary disease noted.

4.  Mildly dilated ascending aorta and aortic root.

5. Coronary artery calcium score 2.85 Agatston units. This places
the patient in the 33rd percentile for age and gender, suggesting
low risk for future cardiac events.

Margielyn Pakhrin

*** End of Addendum ***
EXAM:
OVER-READ INTERPRETATION  CT CHEST

The following report is an over-read performed by radiologist Dr.
Sarai Duhon [REDACTED] on 08/12/2019. This over-read
does not include interpretation of cardiac or coronary anatomy or
pathology. The coronary CTA interpretation by the cardiologist is
attached.
FINDINGS: Vascular: Aortic atherosclerosis. No central pulmonary embolism, on
this non-dedicated study.

Mediastinum/Nodes: No imaged thoracic adenopathy.

Lungs/Pleura: No pleural fluid. Minimal clustered partially
calcified nodularity within the left upper lobe is likely post
infectious or inflammatory in 29/15.

A 2 mm left lower lobe pulmonary nodule on 50/15.

A calcified right middle lobe pulmonary nodule on 22/15.

Upper Abdomen: Normal imaged portions of the liver, spleen, stomach.

Musculoskeletal: No acute osseous abnormality.
IMPRESSION: 1.  No acute findings in the imaged extracardiac chest.
2.  Aortic Atherosclerosis (GALUZ-YG6.6).
3. Left lower lobe pulmonary nodule of 2 mm. No follow-up needed if
patient is low-risk. Non-contrast chest CT can be considered in 12
months if patient is high-risk. This recommendation follows the
consensus statement: Guidelines for Management of Incidental
Pulmonary Nodules Detected on CT Images: From the [HOSPITAL]

## 2022-03-11 DIAGNOSIS — M25512 Pain in left shoulder: Secondary | ICD-10-CM | POA: Diagnosis not present

## 2022-03-14 DIAGNOSIS — M25512 Pain in left shoulder: Secondary | ICD-10-CM | POA: Diagnosis not present

## 2022-03-18 DIAGNOSIS — M25512 Pain in left shoulder: Secondary | ICD-10-CM | POA: Diagnosis not present

## 2022-03-19 ENCOUNTER — Encounter: Payer: Self-pay | Admitting: Cardiovascular Disease

## 2022-03-21 DIAGNOSIS — M25512 Pain in left shoulder: Secondary | ICD-10-CM | POA: Diagnosis not present

## 2022-03-25 DIAGNOSIS — M25512 Pain in left shoulder: Secondary | ICD-10-CM | POA: Diagnosis not present

## 2022-03-28 DIAGNOSIS — M25512 Pain in left shoulder: Secondary | ICD-10-CM | POA: Diagnosis not present

## 2022-04-01 DIAGNOSIS — M25512 Pain in left shoulder: Secondary | ICD-10-CM | POA: Diagnosis not present

## 2022-04-03 ENCOUNTER — Encounter: Payer: Self-pay | Admitting: Cardiovascular Disease

## 2022-04-04 DIAGNOSIS — M25512 Pain in left shoulder: Secondary | ICD-10-CM | POA: Diagnosis not present

## 2022-04-08 DIAGNOSIS — M25512 Pain in left shoulder: Secondary | ICD-10-CM | POA: Diagnosis not present

## 2022-04-11 DIAGNOSIS — M25512 Pain in left shoulder: Secondary | ICD-10-CM | POA: Diagnosis not present

## 2022-04-15 DIAGNOSIS — M25512 Pain in left shoulder: Secondary | ICD-10-CM | POA: Diagnosis not present

## 2022-04-18 DIAGNOSIS — M25512 Pain in left shoulder: Secondary | ICD-10-CM | POA: Diagnosis not present

## 2022-04-21 DIAGNOSIS — M25512 Pain in left shoulder: Secondary | ICD-10-CM | POA: Diagnosis not present

## 2022-04-23 DIAGNOSIS — M25512 Pain in left shoulder: Secondary | ICD-10-CM | POA: Diagnosis not present

## 2022-04-30 ENCOUNTER — Other Ambulatory Visit: Payer: Self-pay | Admitting: Cardiology

## 2022-04-30 DIAGNOSIS — H40013 Open angle with borderline findings, low risk, bilateral: Secondary | ICD-10-CM | POA: Diagnosis not present

## 2022-04-30 DIAGNOSIS — I480 Paroxysmal atrial fibrillation: Secondary | ICD-10-CM

## 2022-04-30 DIAGNOSIS — M25512 Pain in left shoulder: Secondary | ICD-10-CM | POA: Diagnosis not present

## 2022-04-30 DIAGNOSIS — H25012 Cortical age-related cataract, left eye: Secondary | ICD-10-CM | POA: Diagnosis not present

## 2022-04-30 DIAGNOSIS — I82621 Acute embolism and thrombosis of deep veins of right upper extremity: Secondary | ICD-10-CM

## 2022-05-02 DIAGNOSIS — M25512 Pain in left shoulder: Secondary | ICD-10-CM | POA: Diagnosis not present

## 2022-05-06 DIAGNOSIS — M25512 Pain in left shoulder: Secondary | ICD-10-CM | POA: Diagnosis not present

## 2022-05-09 DIAGNOSIS — M25512 Pain in left shoulder: Secondary | ICD-10-CM | POA: Diagnosis not present

## 2022-05-13 DIAGNOSIS — M25512 Pain in left shoulder: Secondary | ICD-10-CM | POA: Diagnosis not present

## 2022-05-16 DIAGNOSIS — M25512 Pain in left shoulder: Secondary | ICD-10-CM | POA: Diagnosis not present

## 2022-05-20 DIAGNOSIS — M25512 Pain in left shoulder: Secondary | ICD-10-CM | POA: Diagnosis not present

## 2022-05-23 DIAGNOSIS — M25512 Pain in left shoulder: Secondary | ICD-10-CM | POA: Diagnosis not present

## 2022-05-27 ENCOUNTER — Ambulatory Visit: Payer: Federal, State, Local not specified - PPO | Admitting: Cardiovascular Disease

## 2022-06-03 DIAGNOSIS — M25512 Pain in left shoulder: Secondary | ICD-10-CM | POA: Diagnosis not present

## 2022-06-06 DIAGNOSIS — M25512 Pain in left shoulder: Secondary | ICD-10-CM | POA: Diagnosis not present

## 2022-06-10 ENCOUNTER — Ambulatory Visit (HOSPITAL_COMMUNITY)
Admission: RE | Admit: 2022-06-10 | Discharge: 2022-06-10 | Disposition: A | Discharge status: Home / Self Care | Payer: Federal, State, Local not specified - PPO | Source: Ambulatory Visit | Attending: Internal Medicine | Admitting: Internal Medicine

## 2022-06-10 DIAGNOSIS — M25512 Pain in left shoulder: Secondary | ICD-10-CM | POA: Diagnosis not present

## 2022-06-10 DIAGNOSIS — I480 Paroxysmal atrial fibrillation: Secondary | ICD-10-CM | POA: Diagnosis not present

## 2022-06-10 DIAGNOSIS — I82621 Acute embolism and thrombosis of deep veins of right upper extremity: Secondary | ICD-10-CM | POA: Insufficient documentation

## 2022-06-12 ENCOUNTER — Encounter (HOSPITAL_COMMUNITY): Payer: Self-pay | Admitting: *Deleted

## 2022-06-13 DIAGNOSIS — M25512 Pain in left shoulder: Secondary | ICD-10-CM | POA: Diagnosis not present

## 2022-06-17 DIAGNOSIS — M25512 Pain in left shoulder: Secondary | ICD-10-CM | POA: Diagnosis not present

## 2022-06-20 DIAGNOSIS — M25512 Pain in left shoulder: Secondary | ICD-10-CM | POA: Diagnosis not present

## 2022-06-21 ENCOUNTER — Encounter: Payer: Self-pay | Admitting: Cardiovascular Disease

## 2022-06-24 DIAGNOSIS — M25512 Pain in left shoulder: Secondary | ICD-10-CM | POA: Diagnosis not present

## 2022-06-27 DIAGNOSIS — M25512 Pain in left shoulder: Secondary | ICD-10-CM | POA: Diagnosis not present

## 2022-06-29 ENCOUNTER — Encounter: Payer: Self-pay | Admitting: Cardiovascular Disease

## 2022-06-29 NOTE — Progress Notes (Unsigned)
Cardiology Office Note   Date:  06/30/2022   ID:  Mike Mccann, Mike Mccann Mike Mccann 12, 1962, MRN [id number]  PCP:  Mike Mccann, Mike Mccann  Cardiologist:   Mike Mccann, Mike Mccann   Chief Complaint  Patient presents with   Atrial Fibrillation        Problem list 1. Paroxysmal atrial fibrillation ( CHADS2VASC = 0)  2. Hyperlipidemia   Previous notes from Sept. 28, 2017 Mike Mccann) :   Mike Mccann is a 62 y.o. male with a history of CP> echo 2013 w/ mild-mod biatrial dilat, EF 55%, MV 2014 w/ diaphragmatic attn, EF 44%, hx tachycardia (never caught), GERD, HLD.   Mike Mccann presents for evaluation of syncope, palpitations  He has had intermittent palpitations and tachycardia for several years. The episodes were brief and never caught on ECG. He was not terribly symptomatic with them. However recently, he has had longer duration of just not feeling well, and feeling his heart beat out of rhythm at times. He has not had problems with tachycardia.  Starting in the spring, he would have episodes where he did not feel well but then would feel normal for a while. However, over the last couple of months, he has felt a little bit bad all the time. His heart rate has been running higher than usual, in the 60s and 70s. He has been able to exercise without difficulty. He has been waking up with a headache most days of the week. This is treated with Aleve with success.  This weekend, he had not done anything unusual and had not had anything unusual to eat and drink. He had not had a large amount of alcohol. He began feeling bad, he became diaphoretic and lightheaded. He noted on his feet that that his heart rate was 40. He sat down but did not feel much better. He had a syncopal episode. It did not last very long. EMS was called. Prior to EMS arrival, his heart rate was back up to 50 and he felt much better. He was not transported and no ECG is available. He did not document any heart rates < 40.   He gets  occasional nosebleeds in the winter months, and he uses saline nasal spray to try and prevent those. He has no other bleeding issues. He has had no chest pain or shortness of breath. He's had no increased dyspnea on exertion.   03/06/2016: Mike Mccann is seen today for follow-up of his paroxysmal atrial fibrillation. Since I last saw him in the hospital he has had a cardiac cath which revealed normal coronary arteries. He has normal left ventricle systolic function with an estimated ejection fraction of 55-65%. His echocardiogram reveals normal left ventricle systolic function. He has mild diastolic dysfunction. He has trace mitral regurgitation and mild tricuspid regurgitation.   He has been seen in the A. fib clinic on Oct. 11 , 2017.  He has been using the McGregor monitor.    Has frequent episodes of PAF in the AM.   Typical episodes last 3 hours He has minimal symptoms if he is at work at his desk If he has to walk around , then he might have some lightheadedness and does not feel very well.   Feb. 2, 2018:  Mike Mccann is seen back for follow up of his atrial fib. Saw Mike Mccann in Nov.    Mike Mccann does not want to start any arrhythmic therapy   CHADS2VASC is 0.   Takes Propranolol  Has had several episodes of PAF.   Doesn't make him feel poorly but he can feel like his HR is Irreg.   Has started exercising recently  - swimming again recently    Sept. 11, 2018:  Mike Mccann is seen today for follow up . Is doing great. Has some occasional palpitations.   Has not had any prolonged episodes of Afib.  Exercising regularly .   Is exercising at a moderate pace.   August 24, 2017:  Mike Mccann is seen today for follow up of his PAF  Has not had any further episodes of atrial fib. Has some irregular beats - seems to be stress related.  Still has some palpitations once a week. Exercises about daily   March 09, 2018:  Mike Mccann is seen today for a recurrent episode of paroxysmal atrial fibrillation.  He had a  colonoscopy this morning and was noted to have atrial fibrillation during the procedure.  He was largely asymptomatic but he did feel a little lethargic from the procedure.  He went home and ate some chicken noodle soup.  He took several ECG readings with his [name] - he was definitely in atrial fibrillation.  He then later converted to normal sinus rhythm.  He is feeling well now.  Has any syncope or presyncope  April 06, 2019: Mike Mccann is seen today for follow-up of his paroxysmal atrial fibrillation. Has been having more PAF with vigorous  Exercise  ( tennis )  Takes a 1/4 propranolol when he has these palpitations.   A whole propranolol will cause extreme fatigue .  Also takes metoprolol 1/4 of a 25 mg tab before playing tennis which seems to help  Has been swimming and cycling more  Verifies his PAF with Kardia.  Occurs 4-5 times a month  Occasionally will wake up with atrial fib.    Episodes will last an hour.    Aug. 23, 2023  Mike Mccann is seen for follow up of his PAF  S/p afib ablation with Mike Mccann in 2021 Still  has some PAF - now sees Mike Mccann, Mike Mccann   at [facility]  Is on Flecainide 75 BID which seems to control the afib Perhaps has 1 episode of Afib for an hour every 3-4 weeks    R arm is in a sling .  Possible rotator cuff tear from tennis injury .  Takes Dilt CD 120 a day   Feb. 26, 2024 Mike Mccann is seen for follow up of his PAF Had an Afib ablation from [place] (April 2021)   Is now followed at [facility] for [place] had a shoulder DVT following shoulder surgery  Has taken Eliquis for 4-5 months .  Venous duplex has shown resolution of the DVT .  Will reduce the Eliquis to 2.5 BID for the next month and then DC   Has had 1-2 episodes of PAF over the past month or so. Episodes may last between 30 min - 1 hour.   He's on Flecainide 75 mg BID ,  Dilt CD 120 a day  His CHADS2VASC is 0   Will add Propranolol 10 QID prn      Past Medical History:  Diagnosis Date   Abnormal  finding on EKG October 2013    a) 2-D Echo, Mod RA& mild LA dilation, RVSP 33 mmHg. Nl LV function - EF > 55%. No valvular dz;; b) Myoview: ~ mild basal-mid inferoseptal ischemia: A low risk scan with excellent exercise capacity of 15 METS, exercising for  13 min, but horizontal ST depression in Lat leads;; c) repeat Myoview 02/2013: LOW RISK study w/ diaphragmatic attenuation    Arthritis    hip   Complication of anesthesia    given more medications than usual-"unsure if that happend"   Dyslipidemia, goal LDL below 130    On statin, "ratio is really off"   GERD (gastroesophageal reflux disease)    Paroxysmal atrial fibrillation ([place])     Past Surgical History:  Procedure Laterality Date   ATRIAL FIBRILLATION ABLATION N/A 08/16/2019   Procedure: ATRIAL FIBRILLATION ABLATION;  Surgeon: Thompson Grayer, Mike Mccann;  Location: [place] [facility];  Service: Cardiovascular;  Laterality: N/A;   CARDIAC CATHETERIZATION N/A 02/27/2016   Procedure: Left Heart Cath and Coronary Angiography;  Surgeon: Nelva Bush, Mike Mccann;  Location: [place] [facility];  Service: Cardiovascular;  Laterality: N/A;   CARDIAC CATHETERIZATION N/A 02/27/2016   Procedure: Intravascular Ultrasound/IVUS;  Surgeon: Nelva Bush, Mike Mccann;  Location: [place] [facility];  Service: Cardiovascular;  Laterality: N/A;   COLONOSCOPY  2 years ago   TOTAL HIP ARTHROPLASTY Left 04/04/2014   Procedure: LEFT TOTAL HIP ARTHROPLASTY ANTERIOR APPROACH;  Surgeon: Mauri Pole, Mike Mccann;  Location: [facility];  Service: Orthopedics;  Laterality: Left;   TOTAL HIP ARTHROPLASTY Right 05/02/2014   Procedure: TOTAL RIGHT HIP ARTHROPLASTY ANTERIOR APPROACH;  Surgeon: Mauri Pole, Mike Mccann;  Location: [facility];  Service: Orthopedics;  Laterality: Right;   VASECTOMY  8 years ago   WISDOM TOOTH EXTRACTION  10 years ago    Current Outpatient Medications  Medication Sig Dispense Refill   acetaminophen (TYLENOL) 500 MG tablet Take 500 mg by mouth every 6 (six) hours as needed.  PRN     apixaban (ELIQUIS) 2.5 MG TABS tablet Take 1 tablet (2.5 mg total) by mouth 2 (two) times daily. For one month only, then stop. 60 tablet 0   diltiazem (CARDIZEM CD) 120 MG 24 hr capsule Take 120 mg by mouth daily.     docusate sodium (COLACE) 100 MG capsule Take 100 mg by mouth 2 (two) times daily. PRN     esomeprazole (NEXIUM) 40 MG capsule Take 1 capsule (40 mg total) by mouth daily. 45 capsule 0   ezetimibe (ZETIA) 10 MG tablet Take 10 mg by mouth daily.     flecainide (TAMBOCOR) 150 MG tablet 1/2 tab twice a day Orally     ibuprofen (ADVIL) 200 MG tablet Take 200 mg by mouth every 6 (six) hours as needed. Takes '400mg'$  by mouth as needed.     Multiple Vitamins-Minerals (MULTIVITAMIN ADULT PO) Take 1 tablet by mouth daily.     propranolol (INDERAL) 10 MG tablet Take 1 tablet (10 mg total) by mouth 4 (four) times daily as needed (palpitations). 120 tablet 2   rosuvastatin (CRESTOR) 20 MG tablet Take 20 mg by mouth daily.     sodium chloride (OCEAN) 0.65 % SOLN nasal spray Place 1 spray into both nostrils daily.     vitamin B-12 (CYANOCOBALAMIN) 1000 MCG tablet Take 1,000 mcg by mouth daily.      No current facility-administered medications for this visit.    Allergies:   Patient has no known allergies.    Social History:  The patient  reports that he has never smoked. He has never used smokeless tobacco. He reports current alcohol use. He reports that he does not use drugs.   Family History:  The patient's family history includes Heart attack in his maternal grandfather and maternal uncle.  ROS:  Please see the history of present illness. All other systems are reviewed and negative.    Physical Exam: Blood pressure 108/80, pulse 70, height 6' (1.829 m), weight 159 lb (72.1 kg), SpO2 99 %.       GEN:  Well nourished, well developed in no acute distress HEENT: Normal NECK: No JVD; No carotid bruits LYMPHATICS: No lymphadenopathy CARDIAC: RRR , no murmurs, rubs,  gallops RESPIRATORY:  Clear to auscultation without rales, wheezing or rhonchi  ABDOMEN: Soft, non-tender, non-distended MUSCULOSKELETAL:  No edema; No deformity  SKIN: Warm and dry NEUROLOGIC:  Alert and oriented x 3   EKG:        Recent Labs: No results found for requested labs within last 365 days.    Lipid Panel No results found for: "CHOL", "TRIG", "HDL", "CHOLHDL", "VLDL", "LDLCALC", "LDLDIRECT"   Wt Readings from Last 3 Encounters:  06/30/22 159 lb (72.1 kg)  02/24/22 160 lb (72.6 kg)  12/25/21 161 lb (73 kg)     Other studies Reviewed: Additional studies/ records that were reviewed today include: Office notes and testing.  ASSESSMENT AND PLAN:  1.  Atrial fibrillation: had an ablation in April 2021.   Was not totally successful .  Now sees EP at [facility] .  On flecainide 75 mg twice a day.  He has occasional episodes of breakthrough atrial fibrillation.  We will give him propranolol 10 mg tablets to take on those occasions.    CHADS2VSC is 0           2. Anticoagulation: CHA2DS2VASc=0,   3.  Hyperlipidemia :      4.  History of right arm DVT.  He developed a DVT following his right shoulder surgery.  This was clearly a provoked DVT.  He was treated with Eliquis 5 mg p.o. twice daily for 5 months.  Will lower the dose to 2.5 mg twice a day.  He may stop it in 1 month.      Mike Mccann, Mike Mccann  06/30/2022 8:53 AM    [address],  [address], [place]  [address] Pager [phone] Phone: [phone]; Fax: [phone]

## 2022-06-30 ENCOUNTER — Ambulatory Visit: Payer: Federal, State, Local not specified - PPO | Attending: Cardiovascular Disease | Admitting: Cardiovascular Disease

## 2022-06-30 ENCOUNTER — Encounter: Payer: Self-pay | Admitting: Cardiovascular Disease

## 2022-06-30 VITALS — BP 108/80 | HR 70 | Ht 72.0 in | Wt 159.0 lb

## 2022-06-30 DIAGNOSIS — I480 Paroxysmal atrial fibrillation: Secondary | ICD-10-CM | POA: Diagnosis not present

## 2022-06-30 DIAGNOSIS — I82621 Acute embolism and thrombosis of deep veins of right upper extremity: Secondary | ICD-10-CM | POA: Diagnosis not present

## 2022-06-30 DIAGNOSIS — M25512 Pain in left shoulder: Secondary | ICD-10-CM | POA: Diagnosis not present

## 2022-06-30 MED ORDER — APIXABAN 2.5 MG PO TABS: 2.5000 mg | ORAL_TABLET | Freq: Two times a day (BID) | ORAL | 0 refills | Status: DC

## 2022-06-30 MED ORDER — PROPRANOLOL HCL 10 MG PO TABS: 10.0000 mg | ORAL_TABLET | Freq: Four times a day (QID) | ORAL | 2 refills | Status: DC | PRN

## 2022-06-30 NOTE — Patient Instructions (Signed)
Medication Instructions:   START TAKING PROPRANOLOL 10 MG BY MOUTH 4 TIMES DAILY ONLY AS NEEDED FOR PALPITATIONS  DECREASE YOUR ELIQUIS TO 2.5 MG BY MOUTH TWICE DAILY FOR ONE MONTH ONLY, THEN STOP ALL TOGETHER THEREAFTER.  *If you need a refill on your cardiac medications before your next appointment, please call your pharmacy*    Follow-Up: At [facility], you and your health needs are our priority.  As part of our continuing mission to provide you with exceptional heart care, we have created designated Provider Care Teams.  These Care Teams include your primary Cardiologist (physician) and Advanced Practice Providers (APPs -  Physician Assistants and Nurse Practitioners) who all work together to provide you with the care you need, when you need it.  We recommend signing up for the patient portal called "MyChart".  Sign up information is provided on this After Visit Summary.  MyChart is used to connect with patients for Virtual Visits (Telemedicine).  Patients are able to view lab/test results, encounter notes, upcoming appointments, etc.  Non-urgent messages can be sent to your provider as well.   To learn more about what you can do with MyChart, go to [email or web address].    Your next appointment:   1 year(s)  Provider:   Mertie Moores, MD

## 2022-07-01 DIAGNOSIS — M25512 Pain in left shoulder: Secondary | ICD-10-CM | POA: Diagnosis not present

## 2022-07-04 DIAGNOSIS — M25512 Pain in left shoulder: Secondary | ICD-10-CM | POA: Diagnosis not present

## 2022-07-08 DIAGNOSIS — M25512 Pain in left shoulder: Secondary | ICD-10-CM | POA: Diagnosis not present

## 2022-07-11 DIAGNOSIS — M25512 Pain in left shoulder: Secondary | ICD-10-CM | POA: Diagnosis not present

## 2022-07-14 DIAGNOSIS — M25512 Pain in left shoulder: Secondary | ICD-10-CM | POA: Diagnosis not present

## 2022-07-25 DIAGNOSIS — M25512 Pain in left shoulder: Secondary | ICD-10-CM | POA: Diagnosis not present

## 2022-07-29 DIAGNOSIS — M25512 Pain in left shoulder: Secondary | ICD-10-CM | POA: Diagnosis not present

## 2022-08-01 DIAGNOSIS — M25512 Pain in left shoulder: Secondary | ICD-10-CM | POA: Diagnosis not present

## 2022-08-04 DIAGNOSIS — M25512 Pain in left shoulder: Secondary | ICD-10-CM | POA: Diagnosis not present

## 2022-08-07 DIAGNOSIS — M25512 Pain in left shoulder: Secondary | ICD-10-CM | POA: Diagnosis not present

## 2022-08-12 DIAGNOSIS — M25512 Pain in left shoulder: Secondary | ICD-10-CM | POA: Diagnosis not present

## 2022-08-15 DIAGNOSIS — M25512 Pain in left shoulder: Secondary | ICD-10-CM | POA: Diagnosis not present

## 2022-08-19 DIAGNOSIS — M25512 Pain in left shoulder: Secondary | ICD-10-CM | POA: Diagnosis not present

## 2022-08-21 DIAGNOSIS — I480 Paroxysmal atrial fibrillation: Secondary | ICD-10-CM | POA: Diagnosis not present

## 2022-08-22 DIAGNOSIS — M25512 Pain in left shoulder: Secondary | ICD-10-CM | POA: Diagnosis not present

## 2022-08-26 DIAGNOSIS — M25512 Pain in left shoulder: Secondary | ICD-10-CM | POA: Diagnosis not present

## 2022-08-27 DIAGNOSIS — Z4789 Encounter for other orthopedic aftercare: Secondary | ICD-10-CM | POA: Diagnosis not present

## 2022-08-28 DIAGNOSIS — M25512 Pain in left shoulder: Secondary | ICD-10-CM | POA: Diagnosis not present

## 2022-09-02 DIAGNOSIS — M25512 Pain in left shoulder: Secondary | ICD-10-CM | POA: Diagnosis not present

## 2022-09-05 DIAGNOSIS — M25512 Pain in left shoulder: Secondary | ICD-10-CM | POA: Diagnosis not present

## 2022-09-09 DIAGNOSIS — M25512 Pain in left shoulder: Secondary | ICD-10-CM | POA: Diagnosis not present

## 2022-09-11 DIAGNOSIS — M25512 Pain in left shoulder: Secondary | ICD-10-CM | POA: Diagnosis not present

## 2022-10-06 ENCOUNTER — Other Ambulatory Visit: Payer: Self-pay | Admitting: Cardiovascular Disease

## 2022-11-11 DIAGNOSIS — H40013 Open angle with borderline findings, low risk, bilateral: Secondary | ICD-10-CM | POA: Diagnosis not present

## 2022-11-11 DIAGNOSIS — H25012 Cortical age-related cataract, left eye: Secondary | ICD-10-CM | POA: Diagnosis not present

## 2022-11-26 DIAGNOSIS — E785 Hyperlipidemia, unspecified: Secondary | ICD-10-CM | POA: Diagnosis not present

## 2022-11-26 DIAGNOSIS — R946 Abnormal results of thyroid function studies: Secondary | ICD-10-CM | POA: Diagnosis not present

## 2022-11-26 DIAGNOSIS — Z125 Encounter for screening for malignant neoplasm of prostate: Secondary | ICD-10-CM | POA: Diagnosis not present

## 2022-12-05 DIAGNOSIS — I82621 Acute embolism and thrombosis of deep veins of right upper extremity: Secondary | ICD-10-CM | POA: Diagnosis not present

## 2022-12-05 DIAGNOSIS — Z0000 Encounter for general adult medical examination without abnormal findings: Secondary | ICD-10-CM | POA: Diagnosis not present

## 2022-12-05 DIAGNOSIS — I7300 Raynaud's syndrome without gangrene: Secondary | ICD-10-CM | POA: Diagnosis not present

## 2022-12-05 DIAGNOSIS — R82998 Other abnormal findings in urine: Secondary | ICD-10-CM | POA: Diagnosis not present

## 2022-12-11 DIAGNOSIS — K5900 Constipation, unspecified: Secondary | ICD-10-CM | POA: Diagnosis not present

## 2022-12-11 DIAGNOSIS — Z86010 Personal history of colonic polyps: Secondary | ICD-10-CM | POA: Diagnosis not present

## 2022-12-11 DIAGNOSIS — Z1211 Encounter for screening for malignant neoplasm of colon: Secondary | ICD-10-CM | POA: Diagnosis not present

## 2022-12-11 DIAGNOSIS — K219 Gastro-esophageal reflux disease without esophagitis: Secondary | ICD-10-CM | POA: Diagnosis not present

## 2023-01-12 ENCOUNTER — Inpatient Hospital Stay: Payer: Federal, State, Local not specified - PPO | Attending: Hematology | Admitting: Hematology

## 2023-01-12 ENCOUNTER — Inpatient Hospital Stay: Payer: Federal, State, Local not specified - PPO

## 2023-01-12 VITALS — BP 125/78 | HR 58 | Temp 98.8°F | Resp 17 | Ht 73.5 in | Wt 164.1 lb

## 2023-01-12 DIAGNOSIS — I82621 Acute embolism and thrombosis of deep veins of right upper extremity: Secondary | ICD-10-CM

## 2023-01-12 DIAGNOSIS — Z86718 Personal history of other venous thrombosis and embolism: Secondary | ICD-10-CM | POA: Insufficient documentation

## 2023-01-12 DIAGNOSIS — Z09 Encounter for follow-up examination after completed treatment for conditions other than malignant neoplasm: Secondary | ICD-10-CM | POA: Insufficient documentation

## 2023-01-12 NOTE — Progress Notes (Signed)
HEMATOLOGY/ONCOLOGY CONSULTATION NOTE  Date of Service: 01/12/2023  Patient Care Team: Rodrigo Ran, MD as PCP - General (Internal Medicine) Nahser, Deloris Ping, MD as PCP - Cardiology (Cardiology)  CHIEF COMPLAINTS/PURPOSE OF CONSULTATION: History of provoked Upper extremity DVT.   HISTORY OF PRESENTING ILLNESS:   Mike Mccann is a wonderful 62 y.o. male who has been referred to [place] by Dr. Rodrigo Ran from [facility] for evaluation and management of history of DVT of right upper extremity.  Patient has a history of paroxysmal atrial fibrillation [not on chronic anticoagulation], GERD, dyslipidemia who is in good health overall.  Patient had a right brachial acute nonocclusive DVT on 02/24/2022 in the context of having had right shoulder rotator cuff surgery with immobilization and splinting.  He was treated with Eliquis for 3 to 4 months and repeat ultrasound of the right upper extremity on 06/10/2022 showed resolution of his DVT prior to stopping the Eliquis.  This episode of DVT was considered provoked by his surgery arm immobilization etc..  Patient denies any family history of blood disorders or blood clots.  No other previous personal history of venous thromboembolism.  Patient notes he has medical history of Paroxysmal atrial fibration and has 1 episodoe every 3 months. He had an ablation around 3 years ago, which did not improve his symptoms and was started on medication for it. He has been taking the medication daily without any toxicities.   Patient notes he has had other surgeries in the past without any complications including VTE.   He had lab work-up with his PCP around end of July 2024 which was reviewed on his MyChart online access and showed factor V Leiden factor negative. Prothrombin gene mutation negative. LUPUS anticoagulant negative. Beta-2 anticoagulation negative. Anti-thrombin 3 level in the normal range at 97. Protein-C level in the normal range.  Protein-S level borderline low at 59, lab range from 60-160. Free Protein-S activity level in the normal range at 68%.  Patient notes he has nose bleeds in the winter. Patient also has medical history of Raynaud's syndrorme without gangrene.  He notes he has been up-to-speed with all of his age related vaccines and age related cancer screening.   He denies any new infection issues, fever, chills, night sweats unexpected weight loss, abdominal pain, chest pain, back pain, abnormal bowel movement, or leg swelling.   MEDICAL HISTORY:  Past Medical History:  Diagnosis Date   Abnormal finding on EKG October 2013    a) 2-D Echo, Mod RA& mild LA dilation, RVSP 33 mmHg. Nl LV function - EF > 55%. No valvular dz;; b) Myoview: ~ mild basal-mid inferoseptal ischemia: A low risk scan with excellent exercise capacity of 15 METS, exercising for 13 min, but horizontal ST depression in Lat leads;; c) repeat Myoview 02/2013: LOW RISK study w/ diaphragmatic attenuation    Arthritis    hip   Complication of anesthesia    given more medications than usual-"unsure if that happend"   Dyslipidemia, goal LDL below 130    On statin, "ratio is really off"   GERD (gastroesophageal reflux disease)    Paroxysmal atrial fibrillation (HCC)     SURGICAL HISTORY: Past Surgical History:  Procedure Laterality Date   ATRIAL FIBRILLATION ABLATION N/A 08/16/2019   Procedure: ATRIAL FIBRILLATION ABLATION;  Surgeon: Hillis Range, MD;  Location: [facility] INVASIVE CV LAB;  Service: Cardiovascular;  Laterality: N/A;   CARDIAC CATHETERIZATION N/A 02/27/2016   Procedure: Left Heart Cath and Coronary Angiography;  Surgeon: [name], MD;  Location: [facility] INVASIVE CV LAB;  Service: Cardiovascular;  Laterality: N/A;   CARDIAC CATHETERIZATION N/A 02/27/2016   Procedure: Intravascular Ultrasound/IVUS;  Surgeon: [name], MD;  Location: [facility] INVASIVE CV LAB;  Service: Cardiovascular;  Laterality: N/A;   COLONOSCOPY  2 years ago    TOTAL HIP ARTHROPLASTY Left 04/04/2014   Procedure: LEFT TOTAL HIP ARTHROPLASTY ANTERIOR APPROACH;  Surgeon: [name], MD;  Location: [facility] ORS;  Service: Orthopedics;  Laterality: Left;   TOTAL HIP ARTHROPLASTY Right 05/02/2014   Procedure: TOTAL RIGHT HIP ARTHROPLASTY ANTERIOR APPROACH;  Surgeon: [name], MD;  Location: [facility] ORS;  Service: Orthopedics;  Laterality: Right;   VASECTOMY  8 years ago   WISDOM TOOTH EXTRACTION  10 years ago    SOCIAL HISTORY: Social History   Socioeconomic History   Marital status: Married    Spouse name: Not on file   Number of children: Not on file   Years of education: Not on file   Highest education level: Not on file  Occupational History   Not on file  Tobacco Use   Smoking status: Never   Smokeless tobacco: Never  Substance and Sexual Activity   Alcohol use: Yes    Comment: wine once a month   Drug use: No   Sexual activity: Not on file  Other Topics Concern   Not on file  Social History Narrative   Married father of 2. Works full-time for [employer] in the [employer].   He lives in the [place] and swims in the outside pool in the summer and at the [facility] during the wintertime. He also rides his bicycle when his hips are not bothering him.  Married to Lucky Cowboy (ENT) MD   Social Determinants of Health   Financial Resource Strain: Not on file  Food Insecurity: Not on file  Transportation Needs: Not on file  Physical Activity: Not on file  Stress: Not on file  Social Connections: Not on file  Intimate Partner Violence: Not on file    FAMILY HISTORY: Family History  Problem Relation Age of Onset   Heart attack Maternal Grandfather    Heart attack Maternal Uncle     ALLERGIES:  has No Known Allergies.  MEDICATIONS:  Current Outpatient Medications  Medication Sig Dispense Refill   acetaminophen (TYLENOL) 500 MG tablet Take 500 mg by mouth every 6 (six) hours as needed. PRN     apixaban  (ELIQUIS) 2.5 MG TABS tablet Take 1 tablet (2.5 mg total) by mouth 2 (two) times daily. For one month only, then stop. 60 tablet 0   diltiazem (CARDIZEM CD) 120 MG 24 hr capsule Take 120 mg by mouth daily.     docusate sodium (COLACE) 100 MG capsule Take 100 mg by mouth 2 (two) times daily. PRN     esomeprazole (NEXIUM) 40 MG capsule Take 1 capsule (40 mg total) by mouth daily. 45 capsule 0   ezetimibe (ZETIA) 10 MG tablet Take 10 mg by mouth daily.     flecainide (TAMBOCOR) 150 MG tablet 1/2 tab twice a day Orally     ibuprofen (ADVIL) 200 MG tablet Take 200 mg by mouth every 6 (six) hours as needed. Takes 400mg  by mouth as needed.     Multiple Vitamins-Minerals (MULTIVITAMIN ADULT PO) Take 1 tablet by mouth daily.     propranolol (INDERAL) 10 MG tablet TAKE 1 TABLET(10 MG) BY MOUTH FOUR TIMES DAILY AS NEEDED FOR PALPITATIONS  120 tablet 2   rosuvastatin (CRESTOR) 20 MG tablet Take 20 mg by mouth daily.     sodium chloride (OCEAN) 0.65 % SOLN nasal spray Place 1 spray into both nostrils daily.     vitamin B-12 (CYANOCOBALAMIN) 1000 MCG tablet Take 1,000 mcg by mouth daily.      No current facility-administered medications for this visit.    REVIEW OF SYSTEMS:    10 Point review of Systems was done is negative except as noted above.  PHYSICAL EXAMINATION: ECOG PERFORMANCE STATUS: 1 - Symptomatic but completely ambulatory  . Vitals:   01/12/23 1428  BP: 125/78  Pulse: (!) 58  Resp: 17  Temp: 98.8 F (37.1 C)  SpO2: 100%   Filed Weights   01/12/23 1428  Weight: 164 lb 1.6 oz (74.4 kg)   .Body mass index is 21.36 kg/m.  GENERAL:alert, in no acute distress and comfortable SKIN: no acute rashes, no significant lesions EYES: conjunctiva are pink and non-injected, sclera anicteric OROPHARYNX: MMM, no exudates, no oropharyngeal erythema or ulceration NECK: supple, no JVD LYMPH:  no palpable lymphadenopathy in the cervical, axillary or inguinal regions LUNGS: clear to auscultation  b/l with normal respiratory effort HEART: regular rate & rhythm ABDOMEN:  normoactive bowel sounds , non tender, not distended. Extremity: no pedal edema PSYCH: alert & oriented x 3 with fluent speech NEURO: no focal motor/sensory deficits  LABORATORY DATA:  I have reviewed the data as listed  .    Latest Ref Rng & Units 08/02/2019    8:50 AM 02/27/2016   10:49 AM 01/31/2016    9:11 AM  CBC  WBC 3.4 - 10.8 x10E3/uL 4.3  3.9  5.3   Hemoglobin 13.0 - 17.7 g/dL 52.8  41.3  24.4   Hematocrit 37.5 - 51.0 % 43.8  42.9  45.4   Platelets 150 - 450 x10E3/uL 198  193  213     .    Latest Ref Rng & Units 08/02/2019    8:50 AM 02/27/2016   10:49 AM 01/31/2016    9:11 AM  CMP  Glucose 65 - 99 mg/dL 95  010  77   BUN 6 - 24 mg/dL 15  15  17    Creatinine 0.76 - 1.27 mg/dL 2.72  5.36  6.44   Sodium 134 - 144 mmol/L 135  138  140   Potassium 3.5 - 5.2 mmol/L 4.9  4.5  4.6   Chloride 96 - 106 mmol/L 102  105  104   CO2 20 - 29 mmol/L 27  26  28    Calcium 8.7 - 10.2 mg/dL 9.5  9.6  03.4        RADIOGRAPHIC STUDIES: I have personally reviewed the radiological images as listed and agreed with the findings in the report. No results found.  ASSESSMENT & PLAN:   62 year old gentleman with  #1 History of provoked of acute nonocclusive right brachial vein DVT  October 2023. The extent and location of his DVT can be explained by his significant rotator cuff repair surgery requiring immobilization and the use of a splint including swelling from the surgery causing possible venous compression.  PLAN: -Discussed the reason for today's visit.  -Discussed all the circumstances and details of his previous acute nonocclusive DVT in the right brachial vein associated with his extensive right rotator cuff surgery and immobilization. -He was appropriately treated for 4 months with anticoagulation with Eliquis with resolution of his DVT on repeat ultrasound. -We discussed that this event appears  to  be provoked and the risk of repeat blood clots is about 2% every year and usually not considered high enough in the absence of any other thrombophilias to require long-term anticoagulation. -Patient did have hypercoagulability workup with his primary care physician which was negative for factor V Leiden mutation and prothrombin gene mutations.  He had negative antiphospholipid antibodies. His protein S antigen level was borderline at 59% with the lower range of the limit being at 60.  His protein S activity levels were noted to be clearly in the normal range.  No evidence of congenital or acquired protein S deficiency based on these labs. -No clear indication for long-term anticoagulation from VTE standpoint. -Educated the patient on Provoked vs Unproved DVT.  -Discussed with the patient that according to lab results and with previous history and family history, he had provoked DVT.  -He has follow-up with his cardiologist for management of his paroxysmal atrial fibrillation and at this time is taking as needed calcium channel than beta-blockers and has been told to start Eliquis in case his atrial fibrillation lasts close to more than 24 hours. -No additional lab testing required at this time.  FOLLOW-UP: Return to clinic with PCP  The total time spent in the appointment was 60 minutes*.  All of the patient's questions were answered with apparent satisfaction. The patient knows to call the clinic with any problems, questions or concerns.   Wyvonnia Lora MD MS AAHIVMS [facility] Hematology/Oncology Physician [facility]  .*Total Encounter Time as defined by the Centers for Medicare and Medicaid Services includes, in addition to the face-to-face time of a patient visit (documented in the note above) non-face-to-face time: obtaining and reviewing outside history, ordering and reviewing medications, tests or procedures, care coordination (communications with other health care professionals  or caregivers) and documentation in the medical record.   01/12/2023 11:10 AM    I,Param Shah,acting as a scribe for Wyvonnia Lora, MD.,have documented all relevant documentation on the behalf of Wyvonnia Lora, MD,as directed by  Wyvonnia Lora, MD while in the presence of Wyvonnia Lora, MD.   .I have reviewed the above documentation for accuracy and completeness, and I agree with the above. Johney Maine MD

## 2023-04-14 DIAGNOSIS — J0100 Acute maxillary sinusitis, unspecified: Secondary | ICD-10-CM | POA: Diagnosis not present

## 2023-05-27 ENCOUNTER — Encounter: Payer: Self-pay | Admitting: Cardiovascular Disease

## 2023-05-27 ENCOUNTER — Telehealth (HOSPITAL_BASED_OUTPATIENT_CLINIC_OR_DEPARTMENT_OTHER): Payer: Self-pay | Admitting: *Deleted

## 2023-05-27 NOTE — Telephone Encounter (Signed)
   Pre-operative Risk Assessment    Patient Name: Mike Mccann  DOB: [birth year] MRN: [id number]   Date of last office visit: 06/30/2022 Date of next office visit: 07/03/2023   Request for Surgical Clearance    Procedure:   Colonoscopy  Date of Surgery:  Clearance 06/12/23                                 Surgeon:  Dr.  Charna Elizabeth Surgeon's Group or Practice Name:  [facility] Phone number:  [phone] Fax number:  [phone]   Type of Clearance Requested:   - Medical  - Pharmacy:  Hold Apixaban (Eliquis) Not indicated.    Type of Anesthesia:   Propofol   Additional requests/questions:    Signed, Emmit Pomfret   05/27/2023, 1:12 PM

## 2023-05-27 NOTE — Telephone Encounter (Signed)
   Name: Mike Mccann  DOB: 1960/09/21  MRN: [id number]  Primary Cardiologist: Kristeen Miss, MD  Chart reviewed as part of pre-operative protocol coverage. Because of [name]'s past medical history and time since last visit, he will require a follow-up in-office visit in order to better assess preoperative cardiovascular risk.  Patient is currently scheduled for 07/03/2023 with Dr. Elease Hashimoto and will need sooner appointment due to procedure date of 06/12/2023.  -Patient will also need updated CBC and BMET prior to appointment visit.  Pre-op covering staff: - Please schedule appointment and call patient to inform them. If patient already had an upcoming appointment within acceptable timeframe, please add "pre-op clearance" to the appointment notes so provider is aware. - Please contact requesting surgeon's office via preferred method (i.e, phone, fax) to inform them of need for appointment prior to surgery.    Napoleon Form, Leodis Rains, NP  05/27/2023, 1:22 PM

## 2023-05-27 NOTE — Telephone Encounter (Signed)
Pt has chosen to see Mike Reining, DNP for preop clearance and will keep his appt with Dr. Elease Hashimoto as planned.

## 2023-05-27 NOTE — Telephone Encounter (Signed)
Left message to call back to move appt up sooner as pt now needs preop clearance as well. Pt is currently schedule to see DR. Nahser 1 yr f/u. When I left message today there was a 10:20 with Dr. Elease Hashimoto. Left message not sure if that appt will still be available when pt calls back.   Per preop APP to schedule sooner appt as needing preop clearance.

## 2023-05-28 NOTE — Progress Notes (Signed)
Cardiology Office Note:  .   Date:  05/29/2023  ID:  Mike Mccann, DOB 1960-07-17, MRN [id number] PCP: Rodrigo Ran, MD  [facility] Providers Cardiologist: Kristeen Miss, MD }   History of Present Illness: .   Mike Mccann is a 63 y.o. male with known history of PAF, hyperlipidemia, intermittent palpitations.  He did have A-fib ablation with Dr. Johney Frame in April 2021 and is now followed by [facility] for EP.  He had a history of DVT of the shoulder following shoulder surgery and had taken Eliquis for 4 to 5 months.  Follow-up venous duplex showed resolution of DVT.    When last seen by Dr. Elease Hashimoto on 06/30/2022, Eliquis was reduced to 2.5 mg twice daily for 1 month and then discontinued.  He remains on flecainide 75 mg twice daily, diltiazem 120 mg daily for A-fib rate control.  Dr. Elease Hashimoto added propranolol 10 mg 4 times daily as needed.  He is here for preoperative cardiac evaluation for colonoscopy scheduled with Dr. Loreta Ave on 06/12/2023.  Questions about holding Eliquis were not requested.  He should not be on Eliquis as this was discontinued in March 2024 by Dr. Elease Hashimoto as above.  He comes today without complaint.  He is very physically active swimming working out medically compliant with no cardiac complaints.  He has not felt his heart be out of rhythm, racing, or feeling tired from bradycardia.  ROS: As above, otherwise negative  Studies Reviewed: [name]   EKG Interpretation Date/Time:  Friday May 29 2023 08:48:31 EST Ventricular Rate:  56 PR Interval:  220 QRS Duration:  108 QT Interval:  442 QTC Calculation: 426 R Axis:   87  Text Interpretation: Sinus bradycardia with 1st degree A-V block Incomplete right bundle branch block When compared with ECG of 13-Sep-2019 08:37, PR interval has increased Confirmed by Joni Reining [phone]) on 05/29/2023 8:58:44 AM     Physical Exam:   VS:  BP 110/70 (BP Location: Left Arm, Patient Position: Sitting, Cuff Size: Normal)   Pulse (!) 56    Ht 6' (1.829 m)   Wt 162 lb 9.6 oz (73.8 kg)   SpO2 100%   BMI 22.05 kg/m    Wt Readings from Last 3 Encounters:  05/29/23 162 lb 9.6 oz (73.8 kg)  01/12/23 164 lb 1.6 oz (74.4 kg)  06/30/22 159 lb (72.1 kg)    GEN: Well nourished, well developed in no acute distress NECK: No JVD; No carotid bruits CARDIAC: RRR, no murmurs, rubs, gallops RESPIRATORY:  Clear to auscultation without rales, wheezing or rhonchi  ABDOMEN: Soft, non-tender, non-distended EXTREMITIES:  No edema; No deformity   ASSESSMENT AND PLAN: .   Pre-Operative Clearance:  According to the Revised Cardiac Risk Index (RCRI), his Perioperative Risk of Major Cardiac Event is (%): 0.4  His Functional Capacity in METs is: 9.89 according to the [facility] Activity Status Index (DASI).   He is not on any anticoagulation.  Eliquis was stopped 1 year ago.  Therefore, based on ACC/AHA guidelines, patient would be at acceptable risk for the planned procedure without further cardiovascular testing. I will route this recommendation to the requesting party via Epic fax function.        2.  Paroxysmal atrial fibrillation: History of A-fib ablation in 2021, remains on flecainide and diltiazem.  EKG has first-degree AV block with normal QTc.  He is bradycardic.  Will not make any changes in his regimen at this time.  He has not had to  take any extra doses of propranolol for racing heart rate in over 7 months.  He is being followed by Dr. Jean Rosenthal at [facility] for EP.  He is no longer on anticoagulation, CHA2DS2-VASc score 0.  3.  Hypercholesterolemia: Followed by PCP.  He remains on Zetia and rosuvastatin.  Goal of LDL less than 100.  Signed, Bettey Mare. Liborio Nixon, ANP, AACC

## 2023-05-29 ENCOUNTER — Ambulatory Visit: Payer: Federal, State, Local not specified - PPO | Attending: Adult Health | Admitting: Adult Health

## 2023-05-29 ENCOUNTER — Encounter: Payer: Self-pay | Admitting: Adult Health

## 2023-05-29 VITALS — BP 110/70 | HR 56 | Ht 72.0 in | Wt 162.6 lb

## 2023-05-29 DIAGNOSIS — I480 Paroxysmal atrial fibrillation: Secondary | ICD-10-CM

## 2023-05-29 DIAGNOSIS — E7800 Pure hypercholesterolemia, unspecified: Secondary | ICD-10-CM | POA: Diagnosis not present

## 2023-05-29 DIAGNOSIS — Z01818 Encounter for other preprocedural examination: Secondary | ICD-10-CM

## 2023-05-29 NOTE — Patient Instructions (Signed)
Medication Instructions:  No Changes *If you need a refill on your cardiac medications before your next appointment, please call your pharmacy*   Lab Work: No labs If you have labs (blood work) drawn today and your tests are completely normal, you will receive your results only by: MyChart Message (if you have MyChart) OR A paper copy in the mail If you have any lab test that is abnormal or we need to change your treatment, we will call you to review the results.   Testing/Procedures: No Testing   Follow-Up: At [facility], you and your health needs are our priority.  As part of our continuing mission to provide you with exceptional heart care, we have created designated Provider Care Teams.  These Care Teams include your primary Cardiologist (physician) and Advanced Practice Providers (APPs -  Physician Assistants and Nurse Practitioners) who all work together to provide you with the care you need, when you need it.  We recommend signing up for the patient portal called "MyChart".  Sign up information is provided on this After Visit Summary.  MyChart is used to connect with patients for Virtual Visits (Telemedicine).  Patients are able to view lab/test results, encounter notes, upcoming appointments, etc.  Non-urgent messages can be sent to your provider as well.   To learn more about what you can do with MyChart, go to [email or web address].    Your next appointment:   1 year(s)  Provider:   Kristeen Miss, MD

## 2023-06-12 DIAGNOSIS — Z860101 Personal history of adenomatous and serrated colon polyps: Secondary | ICD-10-CM | POA: Diagnosis not present

## 2023-06-12 DIAGNOSIS — Z1211 Encounter for screening for malignant neoplasm of colon: Secondary | ICD-10-CM | POA: Diagnosis not present

## 2023-06-26 ENCOUNTER — Encounter: Payer: Self-pay | Admitting: Cardiovascular Disease

## 2023-07-02 ENCOUNTER — Encounter: Payer: Self-pay | Admitting: Cardiovascular Disease

## 2023-07-02 NOTE — Progress Notes (Unsigned)
 Cardiology Office Note   Date:  07/03/2023   ID:  Mike Mccann [birth year], MRN [id number]  PCP:  Mike Ran, MD  Cardiologist:   Mike Miss, MD   Chief Complaint  Patient presents with   Atrial Fibrillation   Problem list 1. Paroxysmal atrial fibrillation ( CHADS2VASC = 0)  2. Hyperlipidemia   Previous notes from Sept. 28, 2017 Mike Mccann) :   Mike Mccann is a 63 y.o. male with a history of CP> echo 2013 w/ mild-mod biatrial dilat, EF 55%, MV 2014 w/ diaphragmatic attn, EF 44%, hx tachycardia (never caught), GERD, HLD.   Mike Mccann presents for evaluation of syncope, palpitations  He has had intermittent palpitations and tachycardia for several years. The episodes were brief and never caught on ECG. He was not terribly symptomatic with them. However recently, he has had longer duration of just not feeling well, and feeling his heart beat out of rhythm at times. He has not had problems with tachycardia.  Starting in the spring, he would have episodes where he did not feel well but then would feel normal for a while. However, over the last couple of months, he has felt a little bit bad all the time. His heart rate has been running higher than usual, in the 60s and 70s. He has been able to exercise without difficulty. He has been waking up with a headache most days of the week. This is treated with Aleve with success.  This weekend, he had not done anything unusual and had not had anything unusual to eat and drink. He had not had a large amount of alcohol. He began feeling bad, he became diaphoretic and lightheaded. He noted on his feet that that his heart rate was 40. He sat down but did not feel much better. He had a syncopal episode. It did not last very long. EMS was called. Prior to EMS arrival, his heart rate was back up to 50 and he felt much better. He was not transported and no ECG is available. He did not document any heart rates < 40.   He gets  occasional nosebleeds in the winter months, and he uses saline nasal spray to try and prevent those. He has no other bleeding issues. He has had no chest pain or shortness of breath. He's had no increased dyspnea on exertion.   03/06/2016: Mike Mccann is seen today for follow-up of his paroxysmal atrial fibrillation. Since I last saw him in the hospital he has had a cardiac cath which revealed normal coronary arteries. He has normal left ventricle systolic function with an estimated ejection fraction of 55-65%. His echocardiogram reveals normal left ventricle systolic function. He has mild diastolic dysfunction. He has trace mitral regurgitation and mild tricuspid regurgitation.   He has been seen in the A. fib clinic on Oct. 11 , 2017.  He has been using the [place] monitor.    Has frequent episodes of PAF in the AM.   Typical episodes last 3 hours He has minimal symptoms if he is at work at his desk If he has to walk around , then he might have some lightheadedness and does not feel very well.   Feb. 2, 2018:  Mike Mccann is seen back for follow up of his atrial fib. Saw Mike Mccann in Nov.    Mike Mccann does not want to start any arrhythmic therapy   CHADS2VASC is 0.   Takes Propranolol  Has had several episodes  of PAF.   Doesn't make him feel poorly but he can feel like his HR is Irreg.   Has started exercising recently  - swimming again recently    Sept. 11, 2018:  Mike Mccann is seen today for follow up . Is doing great. Has some occasional palpitations.   Has not had any prolonged episodes of Afib.  Exercising regularly .   Is exercising at a moderate pace.   August 24, 2017:  Mike Mccann is seen today for follow up of his PAF  Has not had any further episodes of atrial fib. Has some irregular beats - seems to be stress related.  Still has some palpitations once a week. Exercises about daily   March 09, 2018:  Mike Mccann is seen today for a recurrent episode of paroxysmal atrial fibrillation.  He had a  colonoscopy this morning and was noted to have atrial fibrillation during the procedure.  He was largely asymptomatic but he did feel a little lethargic from the procedure.  He went home and ate some chicken noodle soup.  He took several ECG readings with his [name] - he was definitely in atrial fibrillation.  He then later converted to normal sinus rhythm.  He is feeling well now.  Has any syncope or presyncope  April 06, 2019: Mike Mccann is seen today for follow-up of his paroxysmal atrial fibrillation. Has been having more PAF with vigorous  Exercise  ( tennis )  Takes a 1/4 propranolol when he has these palpitations.   A whole propranolol will cause extreme fatigue .  Also takes metoprolol 1/4 of a 25 mg tab before playing tennis which seems to help  Has been swimming and cycling more  Verifies his PAF with Kardia.  Occurs 4-5 times a month  Occasionally will wake up with atrial fib.    Episodes will last an hour.    Aug. 23, 2023  Mike Mccann is seen for follow up of his PAF  S/p afib ablation with Mike Mccann in 2021 Still  has some PAF - now sees Mike Muta, MD   at [facility]  Is on Flecainide 75 BID which seems to control the afib Perhaps has 1 episode of Afib for an hour every 3-4 weeks    R arm is in a sling .  Possible rotator cuff tear from tennis injury .  Takes Dilt CD 120 a day   Feb. 26, 2024 Mike Mccann is seen for follow up of his PAF Had an Afib ablation from Mike Mccann (April 2021)   Is now followed at [facility] for EP  Mike Mccann had a shoulder DVT following shoulder surgery  Has taken Eliquis for 4-5 months .  Venous duplex has shown resolution of the DVT .  Will reduce the Eliquis to 2.5 BID for the next month and then DC   Has had 1-2 episodes of PAF over the past month or so. Episodes may last between 30 min - 1 hour.   He's on Flecainide 75 mg BID ,  Dilt CD 120 a day  His CHADS2VASC is 0   Will add Propranolol 10 QID prn     Feb . 28, 2025 Mike Mccann is seen for follow up of his PAF  and right shoulder DVT. He was on eliquis for 6 months  Has seen hematology  - no hypercoaguable issue  Is followed at [facility]   No significant  episodes of PAF Every several months or so  CHADS2VASC is 0 Will be 1 in April 2026.  We discussed the fact the he should consider starting Eliquis after age 89 He will discuss with her EP doctor at [facility]   He will have a CHADS2VASC score of 2 at age 89 and should be on eliquis by then in not already on it       Past Medical History:  Diagnosis Date   Abnormal finding on EKG October 2013    a) 2-D Echo, Mod RA& mild LA dilation, RVSP 33 mmHg. Nl LV function - EF > 55%. No valvular dz;; b) Myoview: ~ mild basal-mid inferoseptal ischemia: A low risk scan with excellent exercise capacity of 15 METS, exercising for 13 min, but horizontal ST depression in Lat leads;; c) repeat Myoview 02/2013: LOW RISK study w/ diaphragmatic attenuation    Arthritis    hip   Complication of anesthesia    given more medications than usual-"unsure if that happend"   Dyslipidemia, goal LDL below 130    On statin, "ratio is really off"   GERD (gastroesophageal reflux disease)    Paroxysmal atrial fibrillation (HCC)     Past Surgical History:  Procedure Laterality Date   ATRIAL FIBRILLATION ABLATION N/A 08/16/2019   Procedure: ATRIAL FIBRILLATION ABLATION;  Surgeon: Hillis Range, MD;  Location: [facility] INVASIVE CV LAB;  Service: Cardiovascular;  Laterality: N/A;   CARDIAC CATHETERIZATION N/A 02/27/2016   Procedure: Left Heart Cath and Coronary Angiography;  Surgeon: [name], MD;  Location: [facility] INVASIVE CV LAB;  Service: Cardiovascular;  Laterality: N/A;   CARDIAC CATHETERIZATION N/A 02/27/2016   Procedure: Intravascular Ultrasound/IVUS;  Surgeon: [name], MD;  Location: [facility] INVASIVE CV LAB;  Service: Cardiovascular;  Laterality: N/A;   COLONOSCOPY  2 years ago   TOTAL HIP ARTHROPLASTY Left 04/04/2014   Procedure: LEFT TOTAL HIP ARTHROPLASTY ANTERIOR  APPROACH;  Surgeon: [name], MD;  Location: [facility] ORS;  Service: Orthopedics;  Laterality: Left;   TOTAL HIP ARTHROPLASTY Right 05/02/2014   Procedure: TOTAL RIGHT HIP ARTHROPLASTY ANTERIOR APPROACH;  Surgeon: [name], MD;  Location: [facility] ORS;  Service: Orthopedics;  Laterality: Right;   VASECTOMY  8 years ago   WISDOM TOOTH EXTRACTION  10 years ago    Current Outpatient Medications  Medication Sig Dispense Refill   acetaminophen (TYLENOL) 500 MG tablet Take 500 mg by mouth every 6 (six) hours as needed. PRN     diltiazem (CARDIZEM CD) 120 MG 24 hr capsule Take 120 mg by mouth daily.     docusate sodium (COLACE) 100 MG capsule Take 100 mg by mouth 2 (two) times daily. PRN     ezetimibe (ZETIA) 10 MG tablet Take 10 mg by mouth daily.     flecainide (TAMBOCOR) 150 MG tablet 1/2 tab twice a day Orally     Multiple Vitamins-Minerals (MULTIVITAMIN ADULT PO) Take 1 tablet by mouth daily.     propranolol (INDERAL) 10 MG tablet TAKE 1 TABLET(10 MG) BY MOUTH FOUR TIMES DAILY AS NEEDED FOR PALPITATIONS 120 tablet 2   rosuvastatin (CRESTOR) 20 MG tablet Take 20 mg by mouth daily.     sodium chloride (OCEAN) 0.65 % SOLN nasal spray Place 1 spray into both nostrils daily.     No current facility-administered medications for this visit.    Allergies:   Patient has no known allergies.    Social History:  The patient  reports that he has never smoked. He has never used smokeless tobacco. He reports current alcohol use. He reports that he does not use drugs.  Family History:  The patient's family history includes Heart attack in his maternal grandfather and maternal uncle.    ROS:  Please see the history of present illness. All other systems are reviewed and negative.    Physical Exam: Blood pressure 104/70, pulse 64, height 6' (1.829 m), weight 160 lb 9.6 oz (72.8 kg), SpO2 98%.       GEN:  Well nourished, well developed in no acute distress HEENT: Normal NECK: No JVD; No carotid  bruits LYMPHATICS: No lymphadenopathy CARDIAC: RRR , no murmurs, rubs, gallops RESPIRATORY:  Clear to auscultation without rales, wheezing or rhonchi  ABDOMEN: Soft, non-tender, non-distended MUSCULOSKELETAL:  No edema; No deformity  SKIN: Warm and dry NEUROLOGIC:  Alert and oriented x 3    EKG:             Recent Labs: No results found for requested labs within last 365 days.    Lipid Panel No results found for: "CHOL", "TRIG", "HDL", "CHOLHDL", "VLDL", "LDLCALC", "LDLDIRECT"   Wt Readings from Last 3 Encounters:  07/03/23 160 lb 9.6 oz (72.8 kg)  05/29/23 162 lb 9.6 oz (73.8 kg)  01/12/23 164 lb 1.6 oz (74.4 kg)     Other studies Reviewed: Additional studies/ records that were reviewed today include: Office notes and testing.  ASSESSMENT AND PLAN:  1.  Atrial fibrillation: had an ablation in April 2021.   Was not totally successful .  Now sees EP at [facility] .  On flecainide 75 mg twice a day.       CHADS2VSC is 0      Mentioned that he may want to consider starting Eliquis when he turns 64.  His CHADS2Vasc  will be 1 at that time.  He will discuss further with his doctors at [facility].     2. Anticoagulation: CHA2DS2VASc=0,   3.  Hyperlipidemia :      4.  History of right arm DVT.   He has right shoulder DVT was caused by shoulder injury/surgery.  He completed his therapy for his DVT.  He has seen hematology and was not found to have any hypercoagulable issues.    Mike Miss, MD  07/03/2023 9:53 AM    [facility] [address] [place],  [address] [place], [place]  [address] Pager [phone] Phone: [phone]; Fax: [phone]

## 2023-07-03 ENCOUNTER — Encounter: Payer: Self-pay | Admitting: Cardiovascular Disease

## 2023-07-03 ENCOUNTER — Ambulatory Visit: Payer: Federal, State, Local not specified - PPO | Attending: Cardiovascular Disease | Admitting: Cardiovascular Disease

## 2023-07-03 VITALS — BP 104/70 | HR 64 | Ht 72.0 in | Wt 160.6 lb

## 2023-07-03 DIAGNOSIS — I480 Paroxysmal atrial fibrillation: Secondary | ICD-10-CM | POA: Diagnosis not present

## 2023-07-03 DIAGNOSIS — I82621 Acute embolism and thrombosis of deep veins of right upper extremity: Secondary | ICD-10-CM

## 2023-07-03 NOTE — Patient Instructions (Signed)
 Medication Instructions:  Your physician recommends that you continue on your current medications as directed. Please refer to the Current Medication list given to you today.  *If you need a refill on your cardiac medications before your next appointment, please call your pharmacy*  Follow-Up: At [facility], you and your health needs are our priority.  As part of our continuing mission to provide you with exceptional heart care, we have created designated Provider Care Teams.  These Care Teams include your primary Cardiologist (physician) and Advanced Practice Providers (APPs -  Physician Assistants and Nurse Practitioners) who all work together to provide you with the care you need, when you need it.  Your next appointment:   1 year(s)  The format for your next appointment:   In Person  Provider:   Lennie Odor, MD {  Other Instructions   1st Floor: - Lobby - Registration  - Pharmacy  - Lab - Cafe  2nd Floor: - PV Lab - Diagnostic Testing (echo, CT, nuclear med)  3rd Floor: - Vacant  4th Floor: - TCTS (cardiothoracic surgery) - AFib Clinic - Structural Heart Clinic - Vascular Surgery  - Vascular Ultrasound  5th Floor: - HeartCare Cardiology (general and EP) - Clinical Pharmacy for coumadin, hypertension, lipid, weight-loss medications, and med management appointments    Valet parking services will be available as well.

## 2023-08-27 DIAGNOSIS — I480 Paroxysmal atrial fibrillation: Secondary | ICD-10-CM | POA: Diagnosis not present

## 2023-08-27 DIAGNOSIS — Z5181 Encounter for therapeutic drug level monitoring: Secondary | ICD-10-CM | POA: Diagnosis not present

## 2023-08-27 DIAGNOSIS — Z79899 Other long term (current) drug therapy: Secondary | ICD-10-CM | POA: Diagnosis not present

## 2023-09-02 DIAGNOSIS — Z5181 Encounter for therapeutic drug level monitoring: Secondary | ICD-10-CM | POA: Diagnosis not present

## 2023-09-02 DIAGNOSIS — Z79899 Other long term (current) drug therapy: Secondary | ICD-10-CM | POA: Diagnosis not present

## 2023-09-04 DIAGNOSIS — M79644 Pain in right finger(s): Secondary | ICD-10-CM | POA: Diagnosis not present

## 2023-09-07 DIAGNOSIS — M79644 Pain in right finger(s): Secondary | ICD-10-CM | POA: Diagnosis not present

## 2023-09-21 DIAGNOSIS — M20011 Mallet finger of right finger(s): Secondary | ICD-10-CM | POA: Diagnosis not present

## 2023-11-25 DIAGNOSIS — H40013 Open angle with borderline findings, low risk, bilateral: Secondary | ICD-10-CM | POA: Diagnosis not present

## 2023-11-25 DIAGNOSIS — H25012 Cortical age-related cataract, left eye: Secondary | ICD-10-CM | POA: Diagnosis not present

## 2024-02-24 DIAGNOSIS — Z125 Encounter for screening for malignant neoplasm of prostate: Secondary | ICD-10-CM | POA: Diagnosis not present

## 2024-02-24 DIAGNOSIS — E785 Hyperlipidemia, unspecified: Secondary | ICD-10-CM | POA: Diagnosis not present

## 2024-02-24 DIAGNOSIS — R946 Abnormal results of thyroid function studies: Secondary | ICD-10-CM | POA: Diagnosis not present

## 2024-03-02 DIAGNOSIS — I2510 Atherosclerotic heart disease of native coronary artery without angina pectoris: Secondary | ICD-10-CM | POA: Diagnosis not present

## 2024-03-02 DIAGNOSIS — I480 Paroxysmal atrial fibrillation: Secondary | ICD-10-CM | POA: Diagnosis not present

## 2024-03-02 DIAGNOSIS — Z1331 Encounter for screening for depression: Secondary | ICD-10-CM | POA: Diagnosis not present

## 2024-03-02 DIAGNOSIS — Z1339 Encounter for screening examination for other mental health and behavioral disorders: Secondary | ICD-10-CM | POA: Diagnosis not present

## 2024-03-02 DIAGNOSIS — R82998 Other abnormal findings in urine: Secondary | ICD-10-CM | POA: Diagnosis not present

## 2024-03-02 DIAGNOSIS — Z0000 Encounter for general adult medical examination without abnormal findings: Secondary | ICD-10-CM | POA: Diagnosis not present

## 2024-07-12 ENCOUNTER — Ambulatory Visit: Admitting: Cardiovascular Disease
